# Patient Record
Sex: Female | Born: 1980 | Race: Black or African American | Hispanic: No | Marital: Single | Smoking: Current some day smoker
Health system: Southern US, Community
[De-identification: ages and names within clinical notes are randomized; demographics above are authoritative.]

## PROBLEM LIST (undated history)

## (undated) DIAGNOSIS — L732 Hidradenitis suppurativa: Secondary | ICD-10-CM

## (undated) DIAGNOSIS — I050 Rheumatic mitral stenosis: Secondary | ICD-10-CM

## (undated) DIAGNOSIS — R0609 Other forms of dyspnea: Secondary | ICD-10-CM

## (undated) DIAGNOSIS — M7650 Patellar tendinitis, unspecified knee: Secondary | ICD-10-CM

## (undated) DIAGNOSIS — I639 Cerebral infarction, unspecified: Secondary | ICD-10-CM

## (undated) DIAGNOSIS — J45909 Unspecified asthma, uncomplicated: Secondary | ICD-10-CM

## (undated) DIAGNOSIS — F331 Major depressive disorder, recurrent, moderate: Secondary | ICD-10-CM

## (undated) DIAGNOSIS — H5330 Unspecified disorder of binocular vision: Secondary | ICD-10-CM

## (undated) DIAGNOSIS — G039 Meningitis, unspecified: Secondary | ICD-10-CM

## (undated) DIAGNOSIS — I5032 Chronic diastolic (congestive) heart failure: Secondary | ICD-10-CM

## (undated) DIAGNOSIS — G5620 Lesion of ulnar nerve, unspecified upper limb: Secondary | ICD-10-CM

## (undated) DIAGNOSIS — R7303 Prediabetes: Secondary | ICD-10-CM

## (undated) DIAGNOSIS — R0600 Dyspnea, unspecified: Secondary | ICD-10-CM

## (undated) DIAGNOSIS — J36 Peritonsillar abscess: Secondary | ICD-10-CM

## (undated) DIAGNOSIS — M549 Dorsalgia, unspecified: Secondary | ICD-10-CM

## (undated) DIAGNOSIS — I10 Essential (primary) hypertension: Secondary | ICD-10-CM

## (undated) HISTORY — DX: Patellar tendinitis, unspecified knee: M76.50

## (undated) HISTORY — DX: Other forms of dyspnea: R06.09

## (undated) HISTORY — DX: Rheumatic mitral stenosis: I05.0

## (undated) HISTORY — DX: Dorsalgia, unspecified: M54.9

## (undated) HISTORY — DX: Unspecified asthma, uncomplicated: J45.909

## (undated) HISTORY — DX: Dyspnea, unspecified: R06.00

## (undated) HISTORY — DX: Unspecified disorder of binocular vision: H53.30

## (undated) HISTORY — DX: Lesion of ulnar nerve, unspecified upper limb: G56.20

## (undated) HISTORY — DX: Peritonsillar abscess: J36

## (undated) HISTORY — DX: Hidradenitis suppurativa: L73.2

## (undated) HISTORY — DX: Chronic diastolic (congestive) heart failure: I50.32

## (undated) HISTORY — PX: SKIN GRAFT: SHX250

## (undated) HISTORY — DX: Prediabetes: R73.03

## (undated) HISTORY — DX: Major depressive disorder, recurrent, moderate: F33.1

---

## 2004-04-10 ENCOUNTER — Other Ambulatory Visit: Payer: Self-pay

## 2004-04-10 ENCOUNTER — Emergency Department: Payer: Self-pay | Admitting: General Practice

## 2004-05-31 ENCOUNTER — Emergency Department: Payer: Self-pay | Admitting: Unknown Physician Specialty

## 2004-06-04 ENCOUNTER — Emergency Department: Payer: Self-pay | Admitting: Unknown Physician Specialty

## 2005-01-15 ENCOUNTER — Ambulatory Visit (HOSPITAL_COMMUNITY): Admission: RE | Admit: 2005-01-15 | Discharge: 2005-01-15 | Payer: Self-pay | Admitting: Gynecology

## 2005-03-09 ENCOUNTER — Observation Stay: Payer: Self-pay | Admitting: Obstetrics and Gynecology

## 2005-04-23 ENCOUNTER — Observation Stay: Payer: Self-pay | Admitting: Obstetrics & Gynecology

## 2005-05-16 ENCOUNTER — Inpatient Hospital Stay: Payer: Self-pay | Admitting: Obstetrics & Gynecology

## 2005-07-01 ENCOUNTER — Emergency Department: Payer: Self-pay | Admitting: General Practice

## 2005-12-25 ENCOUNTER — Emergency Department: Payer: Self-pay | Admitting: Emergency Medicine

## 2006-05-16 ENCOUNTER — Emergency Department: Payer: Self-pay | Admitting: Emergency Medicine

## 2007-03-25 ENCOUNTER — Observation Stay: Payer: Self-pay | Admitting: Obstetrics and Gynecology

## 2007-03-26 ENCOUNTER — Observation Stay: Payer: Self-pay

## 2007-04-03 ENCOUNTER — Inpatient Hospital Stay: Payer: Self-pay | Admitting: Obstetrics and Gynecology

## 2007-07-07 ENCOUNTER — Emergency Department: Payer: Self-pay | Admitting: Emergency Medicine

## 2007-08-08 ENCOUNTER — Emergency Department: Payer: Self-pay | Admitting: Emergency Medicine

## 2007-08-22 ENCOUNTER — Emergency Department: Payer: Self-pay | Admitting: Emergency Medicine

## 2008-04-19 ENCOUNTER — Emergency Department: Payer: Self-pay | Admitting: Emergency Medicine

## 2008-10-09 ENCOUNTER — Encounter: Payer: Self-pay | Admitting: Obstetrics and Gynecology

## 2008-12-18 ENCOUNTER — Encounter: Payer: Self-pay | Admitting: Maternal & Fetal Medicine

## 2009-01-12 ENCOUNTER — Observation Stay: Payer: Self-pay

## 2009-01-29 ENCOUNTER — Encounter: Payer: Self-pay | Admitting: Obstetrics and Gynecology

## 2009-03-05 ENCOUNTER — Inpatient Hospital Stay: Payer: Self-pay

## 2009-04-02 ENCOUNTER — Emergency Department: Payer: Self-pay | Admitting: Emergency Medicine

## 2009-04-25 ENCOUNTER — Emergency Department: Payer: Self-pay | Admitting: Emergency Medicine

## 2010-04-11 ENCOUNTER — Encounter: Payer: Self-pay | Admitting: Obstetrics & Gynecology

## 2010-05-09 ENCOUNTER — Encounter: Payer: Self-pay | Admitting: Obstetrics and Gynecology

## 2010-06-20 ENCOUNTER — Encounter: Payer: Self-pay | Admitting: Maternal and Fetal Medicine

## 2010-07-22 ENCOUNTER — Emergency Department: Payer: Self-pay | Admitting: Emergency Medicine

## 2010-08-08 ENCOUNTER — Encounter: Payer: Self-pay | Admitting: Maternal & Fetal Medicine

## 2010-09-01 ENCOUNTER — Inpatient Hospital Stay: Payer: Self-pay

## 2010-09-29 ENCOUNTER — Emergency Department: Payer: Self-pay | Admitting: Emergency Medicine

## 2011-02-10 ENCOUNTER — Emergency Department: Payer: Self-pay | Admitting: *Deleted

## 2011-12-26 ENCOUNTER — Observation Stay: Payer: Self-pay

## 2011-12-26 LAB — PIH PROFILE
BUN: 5 mg/dL — ABNORMAL LOW (ref 7–18)
Calcium, Total: 8.3 mg/dL — ABNORMAL LOW (ref 8.5–10.1)
Co2: 24 mmol/L (ref 21–32)
HGB: 9.9 g/dL — ABNORMAL LOW (ref 12.0–16.0)
Osmolality: 275 (ref 275–301)
Potassium: 2.9 mmol/L — ABNORMAL LOW (ref 3.5–5.1)
RBC: 3.59 10*6/uL — ABNORMAL LOW (ref 3.80–5.20)
RDW: 13.8 % (ref 11.5–14.5)
Sodium: 140 mmol/L (ref 136–145)
WBC: 12.2 10*3/uL — ABNORMAL HIGH (ref 3.6–11.0)

## 2011-12-26 LAB — PROTEIN / CREATININE RATIO, URINE
Creatinine, Urine: 122.3 mg/dL (ref 30.0–125.0)
Protein, Random Urine: 80 mg/dL — ABNORMAL HIGH (ref 0–12)

## 2011-12-28 ENCOUNTER — Inpatient Hospital Stay: Payer: Self-pay | Admitting: Obstetrics and Gynecology

## 2011-12-28 LAB — PIH PROFILE
Calcium, Total: 8.9 mg/dL (ref 8.5–10.1)
Chloride: 105 mmol/L (ref 98–107)
EGFR (African American): >60
Glucose: 74 mg/dL (ref 65–99)
HGB: 10.5 g/dL — ABNORMAL LOW (ref 12.0–16.0)
MCH: 27.3 pg (ref 26.0–34.0)
MCHC: 32.5 g/dL (ref 32.0–36.0)
MCV: 84 fL (ref 80–100)
Platelet: 342 10*3/uL (ref 150–440)
RBC: 3.84 10*6/uL (ref 3.80–5.20)
Uric Acid: 4.5 mg/dL (ref 2.6–6.0)
WBC: 14.6 10*3/uL — ABNORMAL HIGH (ref 3.6–11.0)

## 2011-12-29 LAB — PIH PROFILE
Anion Gap: 7 (ref 7–16)
BUN: 7 mg/dL (ref 7–18)
Creatinine: 0.66 mg/dL (ref 0.60–1.30)
EGFR (African American): >60
EGFR (Non-African Amer.): >60
HGB: 10.7 g/dL — ABNORMAL LOW (ref 12.0–16.0)
MCHC: 32.9 g/dL (ref 32.0–36.0)
MCV: 84 fL (ref 80–100)
Osmolality: 272 (ref 275–301)
Platelet: 354 10*3/uL (ref 150–440)
Potassium: 3.7 mmol/L (ref 3.5–5.1)
SGOT(AST): 18 U/L (ref 15–37)
WBC: 15.3 10*3/uL — ABNORMAL HIGH (ref 3.6–11.0)

## 2011-12-29 LAB — HEMATOCRIT: HCT: 32.1 % — ABNORMAL LOW (ref 35.0–47.0)

## 2012-01-13 ENCOUNTER — Emergency Department: Payer: Self-pay | Admitting: Emergency Medicine

## 2012-07-17 ENCOUNTER — Emergency Department: Payer: Self-pay | Admitting: Emergency Medicine

## 2012-07-23 ENCOUNTER — Emergency Department: Payer: Self-pay | Admitting: Emergency Medicine

## 2012-07-24 LAB — CBC WITH DIFFERENTIAL/PLATELET
Basophil #: 0.1 10*3/uL (ref 0.0–0.1)
Eosinophil %: 6 %
HGB: 12.4 g/dL (ref 12.0–16.0)
Lymphocyte #: 3.1 10*3/uL (ref 1.0–3.6)
Lymphocyte %: 25.2 %
MCH: 27.7 pg (ref 26.0–34.0)
MCHC: 32.9 g/dL (ref 32.0–36.0)
MCV: 84 fL (ref 80–100)
Monocyte #: 0.7 x10 3/mm (ref 0.2–0.9)
Monocyte %: 5.7 %
Neutrophil #: 7.6 10*3/uL — ABNORMAL HIGH (ref 1.4–6.5)
Neutrophil %: 62.3 %
RDW: 15.2 % — ABNORMAL HIGH (ref 11.5–14.5)

## 2012-07-24 LAB — URIC ACID: Uric Acid: 4.8 mg/dL (ref 2.6–6.0)

## 2013-02-28 ENCOUNTER — Emergency Department: Payer: Self-pay | Admitting: Internal Medicine

## 2013-03-02 LAB — BETA STREP CULTURE(ARMC)

## 2014-01-13 ENCOUNTER — Emergency Department: Payer: Self-pay | Admitting: Emergency Medicine

## 2014-03-18 ENCOUNTER — Emergency Department: Payer: Self-pay | Admitting: Internal Medicine

## 2014-08-27 ENCOUNTER — Emergency Department: Payer: Self-pay | Admitting: Physician Assistant

## 2014-09-03 ENCOUNTER — Emergency Department: Payer: Self-pay | Admitting: Internal Medicine

## 2014-11-14 NOTE — H&P (Signed)
L&D Evaluation:  History:   HPI 34yo [id number] with LMP of 03/26/11 & EDD of 12/31/11 & L/01/11/12 with pt being sent from [facility] significant for BP today of 174/98 & 2+ proteinuria +facial swelling in mornings x 2-3 weeks. Pt denies any HA, blurred vision, abd pain. Pt does have Braxton-Hicks x several weeks. Pt here in [facility] with BP in the range of 123 to 146 over 50-63. No evidence of pre-ecclampsia noted. Pt is feeling B-H's as she has in the past few weeks. Noo ROM, VB, decreased FM or active labor pattern. UC's are every 8 to 10 mins. Prot/creat ratio is 650 today.    Patient's Medical History PTD x 2, Obesity, Anemia, Non-compliant with PNC, hidradenitis supprativa, depression, migraine HA's, UTI, LGSIL, Asthma, Anemia    Patient's Surgical History Lt foot surgery    Medications Pre Natal Vitamins    Allergies NKDA    Social History tobacco    Family History Non-Contributory   ROS:   ROS All systems were reviewed.  HEENT, CNS, GI, GU, Respiratory, CV, Renal and Musculoskeletal systems were found to be normal.   Exam:   Vital Signs stable  See BP's    General no apparent distress    Mental Status clear    Chest clear    Heart normal sinus rhythm, no murmur/gallop/rubs    Abdomen gravid, non-tender    Estimated Fetal Weight Average for gestational age    Back no CVAT    Reflexes 1+    Pelvic 2/60/vtx -2    Mebranes Intact    FHT normal rate with no decels, reactive NST    Ucx regular    Skin dry    Lymph no lymphadenopathy   Impression:   Impression IUP at 36 4/7 weeks with Gest HTN, early pre-ecclampsia   Plan:   Plan 24 h urine for protein.creat    Comments Continue bedrest to keep BP in normal range. Lt side at night   Electronic Signatures: Sharee PimpleJones, Caron W (CNM)  (Signed 21-Jun-13 16:52)  Authored: L&D Evaluation   Last Updated: 21-Jun-13 16:52 by Sharee PimpleJones, Caron W (CNM)

## 2014-11-14 NOTE — H&P (Signed)
L&D Evaluation:  History:   HPI 34 yo [id number] with LMP of 03/26/11 & EDD of 12/31/11 & L/01/11/12 [facility] to [facility] at 1850 and delivered at 1857, SROM of lt mec in the ER, pt had been instructed to return this am for NST and to turn in her 24 h urine for protein but, had no transportation so did not follow through with collection of the urine. Pt deneis any HA, blurred vision, RUQ  pain or any other Symptoms. Pt delivered by [place] Day, RN while CJones being paged. Cjones arrived at 1900 to find baby doing well and delivered spont the placenta. Perineum intact. Pt BP is now treding 161/85 to 173/88. P 90's. Ff and lochia mod.    Presents with contractions    Patient's Medical History PTD x 2, Obesity, Non-compliant with PNC, Hidradenitis Supprativae, Migraine HA's. Depression, UTI, LGSIL, Asthma, Anemia    Patient's Surgical History Lt foot surgery    Medications Pre [place]atal Vitamins    Social History none  tobacco   ROS:   ROS All systems were reviewed.  HEENT, CNS, GI, GU, Respiratory, CV, Renal and Musculoskeletal systems were found to be normal.   Exam:   Vital Signs BP ranges are high:See graphics    General no apparent distress    Mental Status clear    Chest clear    Heart normal sinus rhythm, no murmur/gallop/rubs    Abdomen gravid, non-tender    Estimated Fetal Weight Average for gestational age    Back no CVAT    Edema 1+    Reflexes 2+  Brisk    Clonus negative    Mebranes Ruptured, SROM in ER    Description Lt mec    Ucx regular    Skin dry    Lymph no lymphadenopathy   Impression:   Impression iUP at term delivered   Plan:   Plan Recov er PP, Will watch BP's.    Comments Since pt is bleeding will not repeat the urine prot/creat now. If BP does not stabilize, will treat.   Electronic Signatures: Sharee PimpleJones, Dierra Riesgo W (CNM)  (Signed 23-Jun-13 19:55)  Authored: L&D Evaluation   Last Updated: 23-Jun-13 19:55 by Sharee PimpleJones, Waneda Klammer W (CNM)

## 2014-11-27 ENCOUNTER — Encounter: Payer: Self-pay | Admitting: Emergency Medicine

## 2014-11-27 ENCOUNTER — Emergency Department
Admission: EM | Admit: 2014-11-27 | Discharge: 2014-11-27 | Disposition: A | Discharge status: Home / Self Care | Payer: Medicaid Other | Attending: Emergency Medicine | Admitting: Emergency Medicine

## 2014-11-27 DIAGNOSIS — K047 Periapical abscess without sinus: Secondary | ICD-10-CM | POA: Diagnosis not present

## 2014-11-27 DIAGNOSIS — I10 Essential (primary) hypertension: Secondary | ICD-10-CM | POA: Insufficient documentation

## 2014-11-27 DIAGNOSIS — K088 Other specified disorders of teeth and supporting structures: Secondary | ICD-10-CM | POA: Diagnosis present

## 2014-11-27 DIAGNOSIS — K029 Dental caries, unspecified: Secondary | ICD-10-CM | POA: Diagnosis not present

## 2014-11-27 DIAGNOSIS — K0889 Other specified disorders of teeth and supporting structures: Secondary | ICD-10-CM

## 2014-11-27 DIAGNOSIS — K0381 Cracked tooth: Secondary | ICD-10-CM | POA: Diagnosis not present

## 2014-11-27 MED ORDER — PENICILLIN V POTASSIUM 500 MG PO TABS: 500.0000 mg | ORAL_TABLET | Freq: Four times a day (QID) | ORAL | Status: DC

## 2014-11-27 MED ORDER — OXYCODONE-ACETAMINOPHEN 5-325 MG PO TABS: 1.0000 | ORAL_TABLET | Freq: Three times a day (TID) | ORAL | Status: DC | PRN

## 2014-11-27 MED ORDER — IBUPROFEN 800 MG PO TABS: 800.0000 mg | ORAL_TABLET | Freq: Three times a day (TID) | ORAL | Status: DC | PRN

## 2014-11-27 NOTE — ED Notes (Signed)
Patient with right upper molar pain since this am. Tearful in triage.

## 2014-11-27 NOTE — Discharge Instructions (Signed)
Dental Abscess A dental abscess is a collection of infected fluid (pus) from a bacterial infection in the inner part of the tooth (pulp). It usually occurs at the end of the tooth's root.  CAUSES   Severe tooth decay.  Trauma to the tooth that allows bacteria to enter into the pulp, such as a broken or chipped tooth. SYMPTOMS   Severe pain in and around the infected tooth.  Swelling and redness around the abscessed tooth or in the mouth or face.  Tenderness.  Pus drainage.  Bad breath.  Bitter taste in the mouth.  Difficulty swallowing.  Difficulty opening the mouth.  Nausea.  Vomiting.  Chills.  Swollen neck glands. DIAGNOSIS   A medical and dental history will be taken.  An examination will be performed by tapping on the abscessed tooth.  X-rays may be taken of the tooth to identify the abscess. TREATMENT The goal of treatment is to eliminate the infection. You may be prescribed antibiotic medicine to stop the infection from spreading. A root canal may be performed to save the tooth. If the tooth cannot be saved, it may be pulled (extracted) and the abscess may be drained.  HOME CARE INSTRUCTIONS  Only take over-the-counter or prescription medicines for pain, fever, or discomfort as directed by your caregiver.  Rinse your mouth (gargle) often with salt water ( tsp salt in 8 oz [250 ml] of warm water) to relieve pain or swelling.  Do not drive after taking pain medicine (narcotics).  Do not apply heat to the outside of your face.  Return to your dentist for further treatment as directed. SEEK MEDICAL CARE IF:  Your pain is not helped by medicine.  Your pain is getting worse instead of better. SEEK IMMEDIATE MEDICAL CARE IF:  You have a fever or persistent symptoms for more than 2-3 days.  You have a fever and your symptoms suddenly get worse.  You have chills or a very bad headache.  You have problems breathing or swallowing.  You have trouble  opening your mouth.  You have swelling in the neck or around the eye. Document Released: 06/23/2005 Document Revised: 03/17/2012 Document Reviewed: 10/01/2010 [facility] Patient Information 2015 [place], [place]. This information is not intended to replace advice given to you by your health care provider. Make sure you discuss any questions you have with your health care provider.  Dental Pain Toothache is pain in or around a tooth. It may get worse with chewing or with cold or heat.  HOME CARE  Your dentist may use a numbing medicine during treatment. If so, you may need to avoid eating until the medicine wears off. Ask your dentist about this.  Only take medicine as told by your dentist or doctor.  Avoid chewing food near the painful tooth until after all treatment is done. Ask your dentist about this. GET HELP RIGHT AWAY IF:   The problem gets worse or new problems appear.  You have a fever.  There is redness and puffiness (swelling) of the face, jaw, or neck.  You cannot open your mouth.  There is pain in the jaw.  There is very bad pain that is not helped by medicine. MAKE SURE YOU:   Understand these instructions.  Will watch your condition.  Will get help right away if you are not doing well or get worse. Document Released: 12/10/2007 Document Revised: 09/15/2011 Document Reviewed: 12/10/2007 [facility] Patient Information 2015 [place], [place]. This information is not intended to replace advice given  to you by your health care provider. Make sure you discuss any questions you have with your health care provider.   OPTIONS FOR DENTAL FOLLOW UP CARE  [place] Department of Health and Human Services - Local Safety Net Dental Clinics [email or web address]   [facility] [phone])  [facility]arl Ma [phone])  [place] [phone] ext [phone])  [facility]  [phone])  [facility] [phone]) This clinic caters to the indigent population and is on a lottery system. Location: [employer] [facility], [employer], [address], [place] Clinic Hours: Wednesdays from 6pm - 9pm, patients seen by a lottery system. For dates, call or go to [email or web address] Services: Cleanings, fillings and simple extractions. Payment Options: DENTAL WORK IS FREE OF CHARGE. Bring proof of income or support. Best way to get seen: Arrive at 5:15 pm - this is a lottery, NOT first come/first serve, so arriving earlier will not increase your chances of being seen.     [facility] [phone] Select option 1 for emergencies   Location: [facility], [place], [address], [place] Clinic Hours: No walk-ins accepted - call the day before to schedule an appointment. Check in times are 9:30 am and 1:30 pm. Services: Simple extractions, temporary fillings, pulpectomy/pulp debridement, uncomplicated abscess drainage. Payment Options: PAYMENT IS DUE AT THE TIME OF SERVICE.  Fee is usually $100-200, additional surgical procedures (e.g. abscess drainage) may be extra. Cash, checks, Visa/MasterCard accepted.  Can file Medicaid if patient is covered for dental - patient should call case worker to check. No discount for [facility] patients. Best way to get seen: MUST call the day before and get onto the schedule. Can usually be seen the next 1-2 days. No walk-ins accepted.     [facility] [phone]   Location: [facility], [address], [place] Clinic Hours: M, W, Th, F 8am or 1:30pm, Tues 9a or 1:30 - first come/first served. Services: Simple extractions, temporary fillings, uncomplicated abscess drainage.  You do not need to be an [facility] resident. Payment Options: PAYMENT IS DUE AT THE TIME OF SERVICE. Dental insurance,  otherwise sliding scale - bring proof of income or support. Depending on income and treatment needed, cost is usually $50-200. Best way to get seen: Arrive early as it is first come/first served.     [facility] [phone]   Location: [address] Clinic Hours: Mon-Thu 8a-5p Services: Most basic dental services including extractions and fillings. Payment Options: PAYMENT IS DUE AT THE TIME OF SERVICE. Sliding scale, up to 50% off - bring proof if income or support. Medicaid with dental option accepted. Best way to get seen: Call to schedule an appointment, can usually be seen within 2 weeks OR they will try to see walk-ins - show up at 8a or 2p (you may have to wait).     [facility] [phone] [place] RESIDENTS ONLY   Location: [facility], [address], [place], [place] [address] Clinic Hours: By appointment only. Monday - Thursday 8am-5pm, Friday 8am-12pm Services: Cleanings, fillings, extractions. Payment Options: PAYMENT IS DUE AT THE TIME OF SERVICE. Cash, Visa or MasterCard. Sliding scale - $30 minimum per service. Best way to get seen: Come in to office, complete packet and make an appointment - need proof of income or support monies for each household member and proof of [facility] residence. Usually  takes about a month to get in.     [facility] [phone]   Location: [address] [facility] Clinic Hours: Walk-in Urgent Care Dental Services are offered Monday-Friday mornings only. The numbers of emergencies accepted daily is limited to the number of providers available. Maximum 15 - Mondays, Wednesdays & Thursdays Maximum 10 - Tuesdays & Fridays Services: You do not need to be a [place] resident to be seen for a dental emergency. Emergencies are defined as pain, swelling, abnormal bleeding, or dental trauma. Walkins  will receive x-rays if needed. NOTE: Dental cleaning is not an emergency. Payment Options: PAYMENT IS DUE AT THE TIME OF SERVICE. Minimum co-pay is $40.00 for uninsured patients. Minimum co-pay is $3.00 for Medicaid with dental coverage. Dental Insurance is accepted and must be presented at time of visit. Medicare does not cover dental. Forms of payment: Cash, credit card, checks. Best way to get seen: If not previously registered with the clinic, walk-in dental registration begins at 7:15 am and is on a first come/first serve basis. If previously registered with the clinic, call to make an appointment.     [facility] [phone] [place] RESIDENTS ONLY   Location: [address], [place], [place] Clinic Hours: Mon-Thu 10a-2p Services: Extractions only! Payment Options: FREE (donations accepted) - bring proof of income or support Best way to get seen: Call and schedule an appointment OR come at 8am on the 1st Monday of every month (except for holidays) when it is first come/first served.     [facility] [phone]   Location: [address], [place] Clinic Hours: Friday mornings Services, Payment Options, Best way to get seen: Call for info

## 2014-11-27 NOTE — ED Provider Notes (Signed)
[facility] Emergency Department Provider Note ____________________________________________  Time seen: Approximately 6:14 PM  I have reviewed the triage vital signs and the nursing notes.   HISTORY  Chief Complaint Dental Pain   HPI Tiffany Hickman is a 34 y.o. female presents to the ER for complaints of right upper dental pain. Patient states that the pain has been gradual onset for the last 2-3 days. Patient states that early this morning she felt a pop in her mouth and had a bad taste. Patient states that she thinks she had an abscess that broke. Patient reports she has had multiple fractured teeth for years with intermittent problems. Denies fall or injury. Denies fever. Reports continues to eat and drink well.  History reviewed. No pertinent past medical history.  HTN Diabetes "borderline"  There are no active problems to display for this patient.   History reviewed. No pertinent past surgical history.  No current outpatient prescriptions on file.  Allergies Review of patient's allergies indicates no known allergies.  No family history on file.  Social History History  Substance Use Topics  . Smoking status: Never Smoker   . Smokeless tobacco: Not on file  . Alcohol Use: No    Review of Systems Constitutional: No fever/chills Eyes: No visual changes. ENT: No sore throat. Cardiovascular: Denies chest pain. Respiratory: Denies shortness of breath. Gastrointestinal: No abdominal pain.  No nausea, no vomiting.  No diarrhea.  No constipation. Genitourinary: Negative for dysuria. Musculoskeletal: Negative for back pain. Skin: Negative for rash. Neurological: Negative for headaches, focal weakness or numbness.  10-point ROS otherwise negative.  ____________________________________________   PHYSICAL EXAM:  VITAL SIGNS: ED Triage Vitals  Enc Vitals Group     BP 11/27/14 1702 165/95 mmHg     Pulse Rate 11/27/14 1702 70     Resp  11/27/14 1702 20     Temp 11/27/14 1702 98.3 F (36.8 C)     Temp Source 11/27/14 1702 Oral     SpO2 11/27/14 1702 98 %     Weight 11/27/14 1702 260 lb (117.935 kg)     Height 11/27/14 1702  (1.676 m)     Head Cir --      Peak Flow --      Pain Score 11/27/14 1703 8     Pain Loc --      Pain Edu? --      Excl. in GC? --     Constitutional: Alert and oriented. Well appearing and in no acute distress. Eyes: Conjunctivae are normal. PERRL. EOMI. Head: Atraumatic. Nose: No congestion/rhinnorhea. Mouth/Throat: Mucous membranes are moist.  Oropharynx non-erythematous. Mild to mod TTP to right upper 2-4 teeth, with mild gum erythema and swelling with dental caries. Along gum line appearance of drained abscess, no induration or fluctuance, minimal purulent drainage with expression. Multiple other fracture teeth and dental caries.  Neck: No stridor.  No cervical spine tenderness to palpation. Hematological/Lymphatic/Immunilogical: No cervical lymphadenopathy. Cardiovascular: Normal rate, regular rhythm. Grossly normal heart sounds.  Good peripheral circulation. Respiratory: Normal respiratory effort.  No retractions. Lungs CTAB. Gastrointestinal: Soft and nontender. No distention. No abdominal bruits Musculoskeletal: No lower extremity tenderness nor edema.   Neurologic:  Normal speech and language. No gross focal neurologic deficits are appreciated. Speech is normal. No gait instability. Skin:  Skin is warm, dry and intact. No rash noted. Psychiatric: Mood and affect are normal. Speech and behavior are normal.  ____________________________________________   INITIAL IMPRESSION / ASSESSMENT AND PLAN /  ED COURSE  Pertinent labs & imaging results that were available during my care of the patient were reviewed by me and considered in my medical decision making (see chart for details).  Presents to ER with complaints of right upper dental pain x 2-3 days. Widespread dental decay with  multiple fractures. Appearance of drained abscess, no current abscess palpated or seen, no indication for I/D. Continues to tolerate food and fluids. No acute distress. Will treat with PRN pain medication and Pen VK. Discussed close follow up with PCP and follow up with dentist. Return to ER for new or worsening concerns.  ____________________________________________   FINAL CLINICAL IMPRESSION(S) / ED DIAGNOSES  Final diagnoses:  Pain, dental  Dental abscess     Renford DillsLindsey Aaliyan Brinkmeier, NP 11/27/14 1826  Minna AntisKevin Paduchowski, MD 11/27/14 2356

## 2015-01-02 ENCOUNTER — Encounter: Payer: Self-pay | Admitting: Emergency Medicine

## 2015-01-02 ENCOUNTER — Emergency Department
Admission: EM | Admit: 2015-01-02 | Discharge: 2015-01-02 | Disposition: A | Discharge status: Home / Self Care | Payer: Medicaid Other | Attending: Emergency Medicine | Admitting: Emergency Medicine

## 2015-01-02 DIAGNOSIS — Z792 Long term (current) use of antibiotics: Secondary | ICD-10-CM | POA: Diagnosis not present

## 2015-01-02 DIAGNOSIS — Y998 Other external cause status: Secondary | ICD-10-CM | POA: Diagnosis not present

## 2015-01-02 DIAGNOSIS — L0100 Impetigo, unspecified: Secondary | ICD-10-CM | POA: Insufficient documentation

## 2015-01-02 DIAGNOSIS — Y9389 Activity, other specified: Secondary | ICD-10-CM | POA: Insufficient documentation

## 2015-01-02 DIAGNOSIS — W57XXXA Bitten or stung by nonvenomous insect and other nonvenomous arthropods, initial encounter: Secondary | ICD-10-CM | POA: Insufficient documentation

## 2015-01-02 DIAGNOSIS — Y9289 Other specified places as the place of occurrence of the external cause: Secondary | ICD-10-CM | POA: Diagnosis not present

## 2015-01-02 DIAGNOSIS — S0086XA Insect bite (nonvenomous) of other part of head, initial encounter: Secondary | ICD-10-CM | POA: Diagnosis present

## 2015-01-02 DIAGNOSIS — I10 Essential (primary) hypertension: Secondary | ICD-10-CM | POA: Insufficient documentation

## 2015-01-02 HISTORY — DX: Essential (primary) hypertension: I10

## 2015-01-02 MED ORDER — PREDNISONE 20 MG PO TABS: 40.0000 mg | ORAL_TABLET | Freq: Every day | ORAL | Status: DC

## 2015-01-02 MED ORDER — CEPHALEXIN 500 MG PO CAPS
ORAL_CAPSULE | ORAL | Status: AC
  Administered 2015-01-02: 500 mg via ORAL
  Filled 2015-01-02: qty 1

## 2015-01-02 MED ORDER — CEPHALEXIN 500 MG PO CAPS
500.0000 mg | ORAL_CAPSULE | Freq: Once | ORAL | Status: AC
  Administered 2015-01-02: 500 mg via ORAL

## 2015-01-02 MED ORDER — LORATADINE 10 MG PO TABS: 10.0000 mg | ORAL_TABLET | Freq: Every day | ORAL | Status: DC

## 2015-01-02 MED ORDER — PREDNISONE 20 MG PO TABS
60.0000 mg | ORAL_TABLET | Freq: Once | ORAL | Status: AC
  Administered 2015-01-02: 60 mg via ORAL

## 2015-01-02 MED ORDER — PREDNISONE 10 MG PO TABS
ORAL_TABLET | ORAL | Status: AC
  Administered 2015-01-02: 60 mg via ORAL
  Filled 2015-01-02: qty 6

## 2015-01-02 MED ORDER — CEPHALEXIN 500 MG PO CAPS: 500.0000 mg | ORAL_CAPSULE | Freq: Four times a day (QID) | ORAL | Status: AC

## 2015-01-02 NOTE — ED Notes (Signed)
Pt to ed via EMS from home with c/o swelling to right side of face.  Pt states she noticed it this am when she awoke and is worried a spider bit her during the night.

## 2015-01-02 NOTE — ED Notes (Signed)
Woke up possible insect bite to right face, has swelling and some reddness

## 2015-01-02 NOTE — ED Provider Notes (Signed)
[facility] Emergency Department Provider Note  ____________________________________________  Time seen: Approximately 8:20 AM  I have reviewed the triage vital signs and the nursing notes.   HISTORY  Chief Complaint Facial Swelling and Insect Bite    HPI Tiffany Hickman is a 34 y.o. female arrives here today with swelling of her right cheek states that she thinks she was bitten by a bug yesterday early this morning scratch the area she says it's mildly swollen and now has a yellow crust to it rates her overall pain as a 2 out of 10 nothing particularly making it better or worse and has no other symptoms at this time is here today for further evaluation and treatment   Past Medical History  Diagnosis Date  . Hypertension     There are no active problems to display for this patient.   History reviewed. No pertinent past surgical history.  Current Outpatient Rx  Name  Route  Sig  Dispense  Refill  . cephALEXin (KEFLEX) 500 MG capsule   Oral   Take 1 capsule (500 mg total) by mouth 4 (four) times daily.   40 capsule   0   . ibuprofen (ADVIL,MOTRIN) 800 MG tablet   Oral   Take 1 tablet (800 mg total) by mouth every 8 (eight) hours as needed for mild pain or moderate pain.   15 tablet   0   . loratadine (CLARITIN) 10 MG tablet   Oral   Take 1 tablet (10 mg total) by mouth daily.   10 tablet   0   . oxyCODONE-acetaminophen (ROXICET) 5-325 MG per tablet   Oral   Take 1 tablet by mouth every 8 (eight) hours as needed for moderate pain or severe pain (Do not drive or operate heavy machinery while taking as can cause drowsiness.).   9 tablet   0   . penicillin v potassium (VEETID) 500 MG tablet   Oral   Take 1 tablet (500 mg total) by mouth 4 (four) times daily.   40 tablet   0   . predniSONE (DELTASONE) 20 MG tablet   Oral   Take 2 tablets (40 mg total) by mouth daily with breakfast.   8 tablet   0     Allergies Review of  patient's allergies indicates no known allergies.  Family History  Problem Relation Age of Onset  . Diabetes Mother     Social History History  Substance Use Topics  . Smoking status: Never Smoker   . Smokeless tobacco: Not on file  . Alcohol Use: No    Review of Systems Constitutional: No fever/chills Eyes: No visual changes. ENT: No sore throat. Cardiovascular: Denies chest pain. Respiratory: Denies shortness of breath. Gastrointestinal: No abdominal pain.  No nausea, no vomiting.  No diarrhea.  No constipation. Genitourinary: Negative for dysuria. Musculoskeletal: Negative for back pain. Skin: Negative for rash. Neurological: Negative for headaches, focal weakness or numbness.  10-point ROS otherwise negative.  ____________________________________________   PHYSICAL EXAM:  VITAL SIGNS: ED Triage Vitals  Enc Vitals Group     BP 01/02/15 0816 168/93 mmHg     Pulse Rate 01/02/15 0813 70     Resp 01/02/15 0813 18     Temp 01/02/15 0813 98.2 F (36.8 C)     Temp Source 01/02/15 0813 Oral     SpO2 01/02/15 0813 100 %     Weight 01/02/15 0813 260 lb (117.935 kg)     Height 01/02/15 0813  5\' 6"  (1.676 m)     Head Cir --      Peak Flow --      Pain Score 01/02/15 0815 2     Pain Loc --      Pain Edu? --      Excl. in GC? --     Constitutional: Alert and oriented. Well appearing and in no acute distress. Eyes: Conjunctivae are normal. PERRL. EOMI. Head: Atraumatic. Nose: No congestion/rhinnorhea. Mouth/Throat: Mucous membranes are moist.  Oropharynx non-erythematous. Neck: No stridor.   Cardiovascular: Normal rate, regular rhythm. Grossly normal heart sounds.  Good peripheral circulation. Respiratory: Normal respiratory effort.  No retractions. Lungs CTAB. Musculoskeletal: No lower extremity tenderness nor edema.  No joint effusions. Neurologic:  Normal speech and language. No gross focal neurologic deficits are appreciated. Speech is normal. No gait  instability. Skin: right cheek mildly inflamed has a honey crusted area with mild induration no fluctuance no sign of focal abscess Psychiatric: Mood and affect are normal. Speech and behavior are normal.  ____________________________________________   PROCEDURES  Procedure(s) performed: None  Critical Care performed: No  ____________________________________________   INITIAL IMPRESSION / ASSESSMENT AND PLAN / ED COURSE  Pertinent labs & imaging results that were available during my care of the patient were reviewed by me and considered in my medical decision making (see chart for details).  Initial impression on this patient is a facial infection likely impetigo patient believes she was bitten on the right cheek either yesterday or today by a bug now it's swollen she has yellow crusted area mildly tender to touch denies fevers chills given this will go and start her on Keflex and have her follow-up in 2-3 days for recheck if it persists return here otherwise for any acute concerns or worsening symptoms ____________________________________________   FINAL CLINICAL IMPRESSION(S) / ED DIAGNOSES  Final diagnoses:  Impetigo      Chere Babson Rosalyn GessWilliam C Aamna Mallozzi, PA-C 01/02/15 0845  Sharman CheekPhillip Stafford, MD 01/02/15 [phone]

## 2015-03-18 ENCOUNTER — Encounter: Payer: Self-pay | Admitting: Emergency Medicine

## 2015-03-18 ENCOUNTER — Emergency Department
Admission: EM | Admit: 2015-03-18 | Discharge: 2015-03-18 | Disposition: A | Discharge status: Home / Self Care | Payer: Medicaid Other | Attending: Emergency Medicine | Admitting: Emergency Medicine

## 2015-03-18 DIAGNOSIS — I10 Essential (primary) hypertension: Secondary | ICD-10-CM | POA: Insufficient documentation

## 2015-03-18 DIAGNOSIS — J029 Acute pharyngitis, unspecified: Secondary | ICD-10-CM | POA: Insufficient documentation

## 2015-03-18 LAB — POCT RAPID STREP A: Streptococcus, Group A Screen (Direct): NEGATIVE

## 2015-03-18 MED ORDER — IBUPROFEN 800 MG PO TABS: 800.0000 mg | ORAL_TABLET | Freq: Three times a day (TID) | ORAL | Status: DC | PRN

## 2015-03-18 MED ORDER — LIDOCAINE VISCOUS 2 % MT SOLN: 20.0000 mL | OROMUCOSAL | Status: DC | PRN

## 2015-03-18 MED ORDER — AMOXICILLIN 500 MG PO TABS: 500.0000 mg | ORAL_TABLET | Freq: Two times a day (BID) | ORAL | Status: DC

## 2015-03-18 NOTE — ED Provider Notes (Signed)
Tiffany Hickman  ____________________________________________  Time seen: Approximately 5:18 PM  I have reviewed the triage vital signs and the nursing notes.   HISTORY  Chief Complaint Sore Throat and Generalized Body Aches    HPI Tiffany Hickman is a 34 y.o. female who presents for evaluation of sudden onset of sore throat fever chills body aches. Patient states symptoms developed yesterday and has worsened today.   Past Medical History  Diagnosis Date  . Hypertension     There are no active problems to display for this patient.   History reviewed. No pertinent past surgical history.  Current Outpatient Rx  Name  Route  Sig  Dispense  Refill  . amoxicillin (AMOXIL) 500 MG tablet   Oral   Take 1 tablet (500 mg total) by mouth 2 (two) times daily.   20 tablet   0   . ibuprofen (ADVIL,MOTRIN) 800 MG tablet   Oral   Take 1 tablet (800 mg total) by mouth every 8 (eight) hours as needed.   30 tablet   0   . lidocaine (XYLOCAINE) 2 % solution   Mouth/Throat   Use as directed 20 mLs in the mouth or throat as needed for mouth pain.   100 mL   0     Allergies Review of patient's allergies indicates no known allergies.  Family History  Problem Relation Age of Onset  . Diabetes Mother     Social History Social History  Substance Use Topics  . Smoking status: Never Smoker   . Smokeless tobacco: None  . Alcohol Use: No    Review of Systems Constitutional: Positive fever chills Eyes: No visual changes. ENT: Positive sore throat Cardiovascular: Denies chest pain. Respiratory: Denies shortness of breath. Gastrointestinal: No abdominal pain.  No nausea, no vomiting.  No diarrhea.  No constipation. Genitourinary: Negative for dysuria. Musculoskeletal: Negative for back pain. Skin: Negative for rash. Neurological: Negative for headaches, focal weakness or numbness.  10-point ROS otherwise  negative.  ____________________________________________   PHYSICAL EXAM:  VITAL SIGNS: ED Triage Vitals  Enc Vitals Group     BP 03/18/15 1711 161/91 mmHg     Pulse Rate 03/18/15 1710 76     Resp 03/18/15 1710 20     Temp 03/18/15 1710 98.6 F (37 C)     Temp Source 03/18/15 1710 Oral     SpO2 03/18/15 1710 100 %     Weight 03/18/15 1710 268 lb (121.564 kg)     Height 03/18/15 1710 5\' 6"  (1.676 m)     Head Cir --      Peak Flow --      Pain Score 03/18/15 1711 6     Pain Loc --      Pain Edu? --      Excl. in GC? --     Constitutional: Alert and oriented. Well appearing and in no acute distress. Eyes: Conjunctivae are normal. PERRL. EOMI. Head: Atraumatic. Nose: No congestion/rhinnorhea. Mouth/Throat: Mucous membranes are moist.  Oropharynx very erythematous. Neck: No stridor.   Cardiovascular: Normal rate, regular rhythm. Grossly normal heart sounds.  Good peripheral circulation. Respiratory: Normal respiratory effort.  No retractions. Lungs CTAB. Musculoskeletal: No lower extremity tenderness nor edema.  No joint effusions. Neurologic:  Normal speech and language. No gross focal neurologic deficits are appreciated. No gait instability. Skin:  Skin is warm, dry and intact. No rash noted. Psychiatric: Mood and affect are normal. Speech and behavior are normal.  ____________________________________________   LABS (all labs ordered are listed, but only abnormal results are displayed)  Labs Reviewed  POCT RAPID STREP A   ____________________________________________    PROCEDURES  Procedure(s) performed: None  Critical Care performed: No  ____________________________________________   INITIAL IMPRESSION / ASSESSMENT AND PLAN / ED COURSE  Pertinent labs & imaging results that were available during my care of the patient were reviewed by me and considered in my medical decision making (see chart for details).  Despite negative rapid strep will Rx for  amoxicillin 500 mg 3 times a day #30. Viscous lidocaine Rx given as well. Encouraged Tylenol over-the-counter as needed for fever and aches given for Motrin 800 mg. Patient to follow up with PCP or return to the ER with any worsening symptomology. ____________________________________________   FINAL CLINICAL IMPRESSION(S) / ED DIAGNOSES  Final diagnoses:  Pharyngitis     Evangeline Dakin, PA-C 03/18/15 1812  Sharyn Creamer, MD 03/18/15 2150

## 2015-03-18 NOTE — ED Notes (Signed)
Patient to ER for c/o sore throat, mild pain in ears, generalized body aches, and pressure in head.

## 2015-03-18 NOTE — Discharge Instructions (Signed)

## 2015-04-02 ENCOUNTER — Encounter: Payer: Self-pay | Admitting: Emergency Medicine

## 2015-04-02 ENCOUNTER — Emergency Department
Admission: EM | Admit: 2015-04-02 | Discharge: 2015-04-02 | Disposition: A | Discharge status: Home / Self Care | Payer: Medicaid Other | Attending: Emergency Medicine | Admitting: Emergency Medicine

## 2015-04-02 DIAGNOSIS — K047 Periapical abscess without sinus: Secondary | ICD-10-CM | POA: Diagnosis not present

## 2015-04-02 DIAGNOSIS — E669 Obesity, unspecified: Secondary | ICD-10-CM | POA: Diagnosis not present

## 2015-04-02 DIAGNOSIS — I10 Essential (primary) hypertension: Secondary | ICD-10-CM | POA: Insufficient documentation

## 2015-04-02 DIAGNOSIS — K088 Other specified disorders of teeth and supporting structures: Secondary | ICD-10-CM | POA: Diagnosis present

## 2015-04-02 MED ORDER — AMOXICILLIN 500 MG PO CAPS: 500.0000 mg | ORAL_CAPSULE | Freq: Three times a day (TID) | ORAL | Status: DC

## 2015-04-02 MED ORDER — TRAMADOL HCL 50 MG PO TABS: 50.0000 mg | ORAL_TABLET | Freq: Four times a day (QID) | ORAL | Status: DC | PRN

## 2015-04-02 MED ORDER — LIDOCAINE VISCOUS 2 % MT SOLN
15.0000 mL | Freq: Once | OROMUCOSAL | Status: AC
  Administered 2015-04-02: 15 mL via OROMUCOSAL
  Filled 2015-04-02: qty 15

## 2015-04-02 MED ORDER — IBUPROFEN 800 MG PO TABS
800.0000 mg | ORAL_TABLET | Freq: Once | ORAL | Status: AC
  Administered 2015-04-02: 800 mg via ORAL
  Filled 2015-04-02: qty 1

## 2015-04-02 MED ORDER — IBUPROFEN 800 MG PO TABS: 800.0000 mg | ORAL_TABLET | Freq: Three times a day (TID) | ORAL | Status: DC | PRN

## 2015-04-02 NOTE — Discharge Instructions (Signed)
OPTIONS FOR DENTAL FOLLOW UP CARE ° °[place] Department of Health and Human Services - Local Safety Net Dental Clinics °http://www.ncdhhs.gov/dph/oralhealth/services/safetynetclinics.htm °  °[facility] ([phone]) ° °[facility] [place] ([phone]) ° °[facility] ([phone]) ° °[facility] ([phone]) ° °[facility] ([phone]) °This clinic caters to the indigent population and is on a lottery system. °Location: °[facility], [address], [place] °Clinic Hours: °Wednesdays from 6pm - 9pm, patients seen by a lottery system. °For dates, call or go to [email or web address].[facility].edu/[facility]/patients/Dental-[facility] °Services: °Cleanings, fillings and simple extractions. °Payment Options: °DENTAL WORK IS FREE OF CHARGE. Bring proof of income or support. °Best way to get seen: °Arrive at 5:15 pm - this is a lottery, NOT first come/first serve, so arriving earlier will not increase your chances of being seen. °  °  °[facility] °[phone] °Select option 1 for emergencies °  °Location: °[facility], [address], [place] °Clinic Hours: °No walk-ins accepted - call the day before to schedule an appointment. °Check in times are 9:30 am and 1:30 pm. °Services: °Simple extractions, temporary fillings, pulpectomy/pulp debridement, uncomplicated abscess drainage. °Payment Options: °PAYMENT IS DUE AT THE TIME OF SERVICE.  Fee is usually $100-200, additional surgical procedures (e.g. abscess drainage) may be extra. °Cash, checks, Visa/MasterCard accepted.  Can file Medicaid if patient is covered for dental - patient should call case worker to check. °No discount for [facility] patients. °Best way to get seen: °MUST call the day before and get onto the schedule. Can usually be seen the next 1-2 days. No walk-ins accepted. °  °  °[facility] °[phone] °   °Location: °[facility], [address], [place] °Clinic Hours: °M, W, Th, F 8am or 1:30pm, Tues 9a or 1:30 - first come/first served. °Services: °Simple extractions, temporary fillings, uncomplicated abscess drainage.  You do not need to be an [place] resident. °Payment Options: °PAYMENT IS DUE AT THE TIME OF SERVICE. °Dental insurance, otherwise sliding scale - bring proof of income or support. °Depending on income and treatment needed, cost is usually $50-200. °Best way to get seen: °Arrive early as it is first come/first served. °  °  °[facility] °[phone] °  °Location: °[address] °Clinic Hours: °Mon-Thu 8a-5p °Services: °Most basic dental services including extractions and fillings. °Payment Options: °PAYMENT IS DUE AT THE TIME OF SERVICE. °Sliding scale, up to 50% off - bring proof if income or support. °Medicaid with dental option accepted. °Best way to get seen: °Call to schedule an appointment, can usually be seen within 2 weeks OR they will try to see walk-ins - show up at 8a or 2p (you may have to wait). °  °  °[facility] °[phone] °[place] RESIDENTS ONLY °  °Location: °[facility], [address], [place],  [address] °Clinic Hours: By appointment only. °Monday - Thursday 8am-5pm, Friday 8am-12pm °Services: Cleanings, fillings, extractions. °Payment Options: °PAYMENT IS DUE AT THE TIME OF SERVICE. °Cash, Visa or MasterCard. Sliding scale - $30 minimum per service. °Best way to get seen: °Come in to office, complete packet and make an appointment - need proof of income °or support monies for each household member and proof of [place] residence. °Usually takes about a month to get in. °  °  °[facility] °[phone] °  °Location: °[address]   [place] [facility] Hours: Walk-in Urgent Care Dental Services are offered Monday-Friday  mornings only. The numbers of emergencies accepted daily is limited to the number of providers available. Maximum 15 - Mondays, Wednesdays & Thursdays Maximum 10 - Tuesdays & Fridays Services: You do not need to be a [facility] resident to be seen for a dental emergency. Emergencies are defined as pain, swelling, abnormal bleeding, or dental trauma. Walkins will receive x-rays if needed. NOTE: Dental cleaning is not an emergency. Payment Options: PAYMENT IS DUE AT THE TIME OF SERVICE. Minimum co-pay is $40.00 for uninsured patients. Minimum co-pay is $3.00 for Medicaid with dental coverage. Dental Insurance is accepted and must be presented at time of visit. Medicare does not cover dental. Forms of payment: Cash, credit card, checks. Best way to get seen: If not previously registered with the clinic, walk-in dental registration begins at 7:15 am and is on a first come/first serve basis. If previously registered with the clinic, call to make an appointment.     [facility] [phone] [place] RESIDENTS ONLY   Location: [address] [place], [place] Clinic Hours: Mon-Thu 10a-2p Services: Extractions only! Payment Options: FREE (donations accepted) - bring proof of income or support Best way to get seen: Call and schedule an appointment OR come at 8am on the 1st Monday of every month (except for holidays) when it is first come/first served.     [facility] [phone]   Location: [address] [place], [place] Clinic Hours: Friday mornings Services, Payment Options, Best way to get seen: Call for info  Abscessed Tooth An abscessed tooth is an infection around your tooth. It may be caused by holes or damage to the tooth (cavity) or a dental disease. An abscessed tooth causes mild to very bad pain in and around the tooth. See your dentist right away if you have tooth or gum pain. HOME CARE  Take your medicine as told. Finish it even if you start to  feel better.  Do not drive after taking pain medicine.  Rinse your mouth (gargle) often with salt water ( teaspoon salt in 8 ounces of warm water).  Do not apply heat to the outside of your face. GET HELP RIGHT AWAY IF:   You have a temperature by mouth above 102 F (38.9 C), not controlled by medicine.  You have chills and a very bad headache.  You have problems breathing or swallowing.  Your mouth will not open.  You develop puffiness (swelling) on the neck or around the eye.  Your pain is not helped by medicine.  Your pain is getting worse instead of better. MAKE SURE YOU:   Understand these instructions.  Will watch your condition.  Will get help right away if you are not doing well or get worse. Document Released: 12/10/2007 Document Revised: 09/15/2011 Document Reviewed: 10/01/2010 [facility] Patient Information 2015 [place], [place]. This information is not intended to replace advice given to you by your health care provider. Make sure you discuss any questions you have with your health care provider.

## 2015-04-02 NOTE — ED Notes (Signed)
Pt presents with dental pain to right upper started last night.

## 2015-04-02 NOTE — ED Notes (Signed)
Pt with upper right tooth pain, cheek swollen and warm

## 2015-04-02 NOTE — ED Provider Notes (Signed)
[facility] Emergency Department Provider Note  ____________________________________________  Time seen: Approximately 4:31 PM  I have reviewed the triage vital signs and the nursing notes.   HISTORY  Chief Complaint Dental Pain    HPI Tiffany Hickman is a 34 y.o. female patient complaining of edema to the right lateral facial area. Patient states dental pain onset last night. Patient appeared edema increased while at  work so she left and came to the emergency room. Patient is denies any fever associated with this she denies any dysphagia. Patient is rating the pain as a 10 over 10. Patient stated no palliative measures taken for this complaint.  Past Medical History  Diagnosis Date  . Hypertension     There are no active problems to display for this patient.   History reviewed. No pertinent past surgical history.  Current Outpatient Rx  Name  Route  Sig  Dispense  Refill  . amoxicillin (AMOXIL) 500 MG capsule   Oral   Take 1 capsule (500 mg total) by mouth 3 (three) times daily.   30 capsule   0   . amoxicillin (AMOXIL) 500 MG tablet   Oral   Take 1 tablet (500 mg total) by mouth 2 (two) times daily.   20 tablet   0   . ibuprofen (ADVIL,MOTRIN) 800 MG tablet   Oral   Take 1 tablet (800 mg total) by mouth every 8 (eight) hours as needed.   30 tablet   0   . ibuprofen (ADVIL,MOTRIN) 800 MG tablet   Oral   Take 1 tablet (800 mg total) by mouth every 8 (eight) hours as needed for moderate pain.   15 tablet   0   . lidocaine (XYLOCAINE) 2 % solution   Mouth/Throat   Use as directed 20 mLs in the mouth or throat as needed for mouth pain.   100 mL   0   . traMADol (ULTRAM) 50 MG tablet   Oral   Take 1 tablet (50 mg total) by mouth every 6 (six) hours as needed for moderate pain.   12 tablet   0     Allergies Review of patient's allergies indicates no known allergies.  Family History  Problem Relation Age of Onset  .  Diabetes Mother     Social History Social History  Substance Use Topics  . Smoking status: Never Smoker   . Smokeless tobacco: None  . Alcohol Use: No    Review of Systems Constitutional: No fever/chills Eyes: No visual changes. ENT: No sore throat. Dental pain Cardiovascular: Denies chest pain. Respiratory: Denies shortness of breath. Gastrointestinal: No abdominal pain.  No nausea, no vomiting.  No diarrhea.  No constipation. Genitourinary: Negative for dysuria. Musculoskeletal: Negative for back pain. Skin: Negative for rash. Neurological: Negative for headaches, focal weakness or numbness. Endocrine:Hypertension obesity 10-point ROS otherwise negative.  ____________________________________________   PHYSICAL EXAM:  VITAL SIGNS: ED Triage Vitals  Enc Vitals Group     BP 04/02/15 1624 182/102 mmHg     Pulse Rate 04/02/15 1624 73     Resp 04/02/15 1624 20     Temp 04/02/15 1624 98.5 F (36.9 C)     Temp Source 04/02/15 1624 Oral     SpO2 04/02/15 1624 98 %     Weight 04/02/15 1624 262 lb (118.842 kg)     Height 04/02/15 1624  (1.676 m)     Head Cir --      Peak Flow --  Pain Score --      Pain Loc --      Pain Edu? --      Excl. in GC? --     Constitutional: Alert and oriented. Well appearing and in no acute distress. Eyes: Conjunctivae are normal. PERRL. EOMI. Head: Atraumatic. Nose: No congestion/rhinnorhea. Mouth/Throat: Mucous membranes are moist.  Oropharynx non-erythematous. Neck: No stridor.   Hematological/Lymphatic/Immunilogical: No cervical lymphadenopathy. Cardiovascular: Normal rate, regular rhythm. Grossly normal heart sounds.  Good peripheral circulation. Respiratory: Normal respiratory effort.  No retractions. Lungs CTAB. Gastrointestinal: Soft and nontender. No distention. No abdominal bruits. No CVA tenderness. Musculoskeletal: No lower extremity tenderness nor edema.  No joint effusions. Neurologic:  Normal speech and language.  No gross focal neurologic deficits are appreciated. No gait instability. Skin:  Skin is warm, dry and intact. No rash noted. Psychiatric: Mood and affect are normal. Speech and behavior are normal.  ____________________________________________   LABS (all labs ordered are listed, but only abnormal results are displayed)  Labs Reviewed - No data to display ____________________________________________  EKG   ____________________________________________  RADIOLOGY   ____________________________________________   PROCEDURES  Procedure(s) performed: None  Critical Care performed: No  ________OPTIONS FOR DENTAL FOLLOW UP CARE  [place] Department of Health and Human Services - Local Safety Net Dental Clinics TripDoors.com.htm   [facility] [phone])  Sharl Ma [phone])  [place] [phone])  [facility] [phone])  [facility] [phone]) This clinic caters to the indigent population and is on a lottery system. Location: [employer] [facility] [employer], [address] [place] Clinic Hours: Wednesdays from 6pm - 9pm, patients seen by a lottery system. For dates, call or go to [email or web address] Services: Cleanings, fillings and simple extractions. Payment Options: DENTAL WORK IS FREE OF CHARGE. Bring proof of income or support. Best way to get seen: Arrive at 5:15 pm - this is a lottery, NOT first come/first serve, so arriving earlier will not increase your chances of being seen.     [facility] [phone] Select option 1 for emergencies   Location: [facility], [place], [address] [place] Clinic Hours: No walk-ins accepted - call the day before to schedule an appointment. Check in times are 9:30 am and 1:30 pm. Services: Simple  extractions, temporary fillings, pulpectomy/pulp debridement, uncomplicated abscess drainage. Payment Options: PAYMENT IS DUE AT THE TIME OF SERVICE.  Fee is usually $100-200, additional surgical procedures (e.g. abscess drainage) may be extra. Cash, checks, Visa/MasterCard accepted.  Can file Medicaid if patient is covered for dental - patient should call case worker to check. No discount for [facility] patients. Best way to get seen: MUST call the day before and get onto the schedule. Can usually be seen the next 1-2 days. No walk-ins accepted.     [facility] [phone]   Location: [facility], [address] [place] Clinic Hours: M, W, Th, F 8am or 1:30pm, Tues 9a or 1:30 - first come/first served. Services: Simple extractions, temporary fillings, uncomplicated abscess drainage.  You do not need to be an [facility] resident. Payment Options: PAYMENT IS DUE AT THE TIME OF SERVICE. Dental insurance, otherwise sliding scale - bring proof of income or support. Depending on income and treatment needed, cost is usually $50-200. Best way to get seen: Arrive early as it is first come/first served.     [facility]  [phone]   Location: [address] Clinic Hours: Mon-Thu 8a-5p Services: Most basic dental services including extractions and fillings. Payment Options: PAYMENT IS DUE AT THE TIME OF SERVICE. Sliding scale, up to 50% off - bring proof if income or support. Medicaid with dental option accepted. Best way to get seen: Call to schedule an appointment, can usually be seen within 2 weeks OR they will try to see walk-ins - show up at 8a or 2p (you may have to wait).     [facility] [phone] [place]IDENTS ONLY   Location: [facility], [address], [place], [place] [address] Clinic Hours: By appointment only. Monday -  Thursday 8am-5pm, Friday 8am-12pm Services: Cleanings, fillings, extractions. Payment Options: PAYMENT IS DUE AT THE TIME OF SERVICE. Cash, Visa or MasterCard. Sliding scale - $30 minimum per service. Best way to get seen: Come in to office, complete packet and make an appointment - need proof of income or support monies for each household member and proof of [facility] residence. Usually takes about a month to get in.     [facility] [phone]   Location: [address], [facility] Clinic Hours: Walk-in Urgent Care Dental Services are offered Monday-Friday mornings only. The numbers of emergencies accepted daily is limited to the number of providers available. Maximum 15 - Mondays, Wednesdays & Thursdays Maximum 10 - Tuesdays & Fridays Services: You do not need to be a [facility] resident to be seen for a dental emergency. Emergencies are defined as pain, swelling, abnormal bleeding, or dental trauma. Walkins will receive x-rays if needed. NOTE: Dental cleaning is not an emergency. Payment Options: PAYMENT IS DUE AT THE TIME OF SERVICE. Minimum co-pay is $40.00 for uninsured patients. Minimum co-pay is $3.00 for Medicaid with dental coverage. Dental Insurance is accepted and must be presented at time of visit. Medicare does not cover dental. Forms of payment: Cash, credit card, checks. Best way to get seen: If not previously registered with the clinic, walk-in dental registration begins at 7:15 am and is on a first come/first serve basis. If previously registered with the clinic, call to make an appointment.     [facility] [phone] [place] RESIDENTS ONLY   Location: [address], [place], [place] Clinic Hours: Mon-Thu 10a-2p Services: Extractions only! Payment Options: FREE (donations accepted) - bring proof of income or support Best way to get seen: Call and schedule an appointment OR come at 8am on the  1st Monday of every month (except for holidays) when it is first come/first served.     [facility] [phone]   Location: [address] [place], [place] Clinic Hours: Friday mornings Services, Payment Options, Best way to get seen: Call for info ____________________________________   INITIAL IMPRESSION / ASSESSMENT AND PLAN / ED COURSE  Pertinent labs & imaging results that were available during my care of the patient were reviewed by me and considered in my medical decision making (see chart for details).  Dental abscess right upper and lower .patient was started on Amoxil, ibuprofen, and tramadol. Patient advised to contact the dental clinic to schedule an appointment from the list provided. FINAL CLINICAL IMPRESSION(S) / ED DIAGNOSES  Final diagnoses:  Dental abscess      Joni Reining, PA-C 04/02/15 1648  Emily Filbert, MD 04/03/15 [phone]

## 2015-09-29 ENCOUNTER — Encounter: Payer: Self-pay | Admitting: Emergency Medicine

## 2015-09-29 ENCOUNTER — Emergency Department
Admission: EM | Admit: 2015-09-29 | Discharge: 2015-09-29 | Disposition: A | Discharge status: Home / Self Care | Payer: Medicaid Other | Attending: Emergency Medicine | Admitting: Emergency Medicine

## 2015-09-29 DIAGNOSIS — I10 Essential (primary) hypertension: Secondary | ICD-10-CM | POA: Diagnosis not present

## 2015-09-29 DIAGNOSIS — J029 Acute pharyngitis, unspecified: Secondary | ICD-10-CM | POA: Diagnosis present

## 2015-09-29 DIAGNOSIS — J020 Streptococcal pharyngitis: Secondary | ICD-10-CM

## 2015-09-29 LAB — POCT RAPID STREP A: STREPTOCOCCUS, GROUP A SCREEN (DIRECT): POSITIVE — AB

## 2015-09-29 MED ORDER — AMOXICILLIN 875 MG PO TABS: 875.0000 mg | ORAL_TABLET | Freq: Two times a day (BID) | ORAL | Status: DC

## 2015-09-29 MED ORDER — IBUPROFEN 600 MG PO TABS: 600.0000 mg | ORAL_TABLET | Freq: Four times a day (QID) | ORAL | Status: DC | PRN

## 2015-09-29 MED ORDER — MAGIC MOUTHWASH W/LIDOCAINE: 5.0000 mL | Freq: Four times a day (QID) | ORAL | Status: DC | PRN

## 2015-09-29 NOTE — ED Notes (Signed)
Awoke with sore throat this a.m.

## 2015-09-29 NOTE — Discharge Instructions (Signed)

## 2015-09-29 NOTE — ED Provider Notes (Signed)
[facility] Emergency Department Provider Note  ____________________________________________  Time seen: Approximately 9:37 AM  I have reviewed the triage vital signs and the nursing notes.   HISTORY  Chief Complaint Sore Throat    HPI Tiffany Hickman is a 35 y.o. female , NAD, presents to the emergency department with sudden onset sore throat this morning. Has had difficulty swallowing due to the pain. Denies fever, chills, body aches. No other upper respiratory symptoms such as nasal congestion, runny nose, cough, headache, ear pain. Has not taken anything over-the-counter for her current symptoms. States she has a history of strep infections in the past and this feels similar.Denies abdominal pain, nausea, vomiting. No rash.   Past Medical History  Diagnosis Date  . Hypertension     There are no active problems to display for this patient.   History reviewed. No pertinent past surgical history.  Current Outpatient Rx  Name  Route  Sig  Dispense  Refill  . amoxicillin (AMOXIL) 875 MG tablet   Oral   Take 1 tablet (875 mg total) by mouth 2 (two) times daily.   20 tablet   0   . ibuprofen (ADVIL,MOTRIN) 600 MG tablet   Oral   Take 1 tablet (600 mg total) by mouth every 6 (six) hours as needed.   30 tablet   0   . magic mouthwash w/lidocaine SOLN   Oral   Take 5 mLs by mouth 4 (four) times daily as needed for mouth pain.   240 mL   0     Please mix 80mL diphenhydramine, 80mL nystatin, 80 ...   . traMADol (ULTRAM) 50 MG tablet   Oral   Take 1 tablet (50 mg total) by mouth every 6 (six) hours as needed for moderate pain.   12 tablet   0     Allergies Review of patient's allergies indicates no known allergies.  Family History  Problem Relation Age of Onset  . Diabetes Mother     Social History Social History  Substance Use Topics  . Smoking status: Never Smoker   . Smokeless tobacco: None  . Alcohol Use: No     Review  of Systems  Constitutional: No fever/chills, fatigue Eyes: No visual changes. No discharge, redness, swelling ENT: Positive sore throat. No nasal congestion, ear pain, sinus pressure Cardiovascular: No chest pain. Respiratory: No cough. No shortness of breath. No wheezing.  Gastrointestinal: No abdominal pain.  No nausea, vomiting.  Musculoskeletal: Negative for general myalgias.  Skin: Negative for rash. Neurological: Negative for headaches, focal weakness or numbness. 10-point ROS otherwise negative.  ____________________________________________   PHYSICAL EXAM:  VITAL SIGNS: ED Triage Vitals  Enc Vitals Group     BP 09/29/15 0911 143/91 mmHg     Pulse Rate 09/29/15 0911 92     Resp 09/29/15 0911 20     Temp 09/29/15 0911 98.3 F (36.8 C)     Temp Source 09/29/15 0911 Oral     SpO2 09/29/15 0911 100 %     Weight 09/29/15 0911 268 lb (121.564 kg)     Height 09/29/15 0911  (1.676 m)     Head Cir --      Peak Flow --      Pain Score 09/29/15 0912 8     Pain Loc --      Pain Edu? --      Excl. in GC? --     Constitutional: Alert and oriented. Well appearing and  in no acute distress. Eyes: Conjunctivae are normal.  Head: Atraumatic. ENT:      Ears: TMs visualized bilaterally without erythema, effusion, bulging, perforation.      Nose: No congestion/rhinnorhea.      Mouth/Throat: Mucous membranes are moist. Pharynx with moderate erythema and bilateral tonsils with mild swelling. No exudates or posterior pharyngeal swelling. Neck: Supple with full range of motion. Hematological/Lymphatic/Immunilogical: Positive left anterior focal cervical lymphadenopathy without tenderness and all are mobile. Cardiovascular: Normal rate, regular rhythm. Normal S1 and S2.  Good peripheral circulation. Respiratory: Normal respiratory effort without tachypnea or retractions. Lungs CTAB with breath sounds noted throughout. Neurologic:  Normal speech and language. No gross focal neurologic  deficits are appreciated.  Skin:  Skin is warm, dry and intact. No rash noted. Psychiatric: Mood and affect are normal. Speech and behavior are normal. Patient exhibits appropriate insight and judgement.   ____________________________________________   LABS (all labs ordered are listed, but only abnormal results are displayed)  Labs Reviewed  POCT RAPID STREP A - Abnormal; Notable for the following:    Streptococcus, Group A Screen (Direct) POSITIVE (*)    All other components within normal limits   ____________________________________________  EKG  None ____________________________________________  RADIOLOGY  None ____________________________________________    PROCEDURES  Procedure(s) performed: None    Medications - No data to display   ____________________________________________   INITIAL IMPRESSION / ASSESSMENT AND PLAN / ED COURSE  Pertinent lab results that were available during my care of the patient were reviewed by me and considered in my medical decision making (see chart for details).  Patient's diagnosis is consistent with strep pharyngitis. Patient will be discharged home with prescriptions for amoxicillin, ibuprofen, Magic mouthwash to use as directed. Patient is to follow up with [facility] if symptoms persist past this treatment course. Patient is given ED precautions to return to the ED for any worsening or new symptoms.    ____________________________________________  FINAL CLINICAL IMPRESSION(S) / ED DIAGNOSES  Final diagnoses:  Strep pharyngitis      NEW MEDICATIONS STARTED DURING THIS VISIT:  New Prescriptions   AMOXICILLIN (AMOXIL) 875 MG TABLET    Take 1 tablet (875 mg total) by mouth 2 (two) times daily.   IBUPROFEN (ADVIL,MOTRIN) 600 MG TABLET    Take 1 tablet (600 mg total) by mouth every 6 (six) hours as needed.   MAGIC MOUTHWASH W/LIDOCAINE SOLN    Take 5  mLs by mouth 4 (four) times daily as needed for mouth pain.         Hope PigeonJami L Pammy Vesey, PA-C 09/29/15 [phone]  Jeanmarie PlantJames A McShane, MD 09/29/15 [phone]

## 2015-11-29 ENCOUNTER — Emergency Department: Payer: Medicaid Other

## 2015-11-29 ENCOUNTER — Emergency Department
Admission: EM | Admit: 2015-11-29 | Discharge: 2015-11-29 | Disposition: A | Discharge status: Home / Self Care | Payer: Medicaid Other | Attending: Emergency Medicine | Admitting: Emergency Medicine

## 2015-11-29 ENCOUNTER — Encounter: Payer: Self-pay | Admitting: Emergency Medicine

## 2015-11-29 DIAGNOSIS — M25562 Pain in left knee: Secondary | ICD-10-CM | POA: Insufficient documentation

## 2015-11-29 DIAGNOSIS — I10 Essential (primary) hypertension: Secondary | ICD-10-CM | POA: Insufficient documentation

## 2015-11-29 DIAGNOSIS — J3489 Other specified disorders of nose and nasal sinuses: Secondary | ICD-10-CM

## 2015-11-29 MED ORDER — IBUPROFEN 800 MG PO TABS: 800.0000 mg | ORAL_TABLET | Freq: Three times a day (TID) | ORAL | Status: DC

## 2015-11-29 NOTE — ED Notes (Signed)
Pt to ed with c/o left knee pain that started this am when she awoke.  Pt reports increased pain with ROM.

## 2015-11-29 NOTE — ED Notes (Signed)
States she woke up with pain and stiffness to left knee this am  Denies any injury  Ambulates well  Also developed a swollen gland to left side of face eysterday  No fever or sore throat  Area is tender on palpation

## 2015-11-29 NOTE — Discharge Instructions (Signed)
Follow-up with your [facility] if any continued problems or make an appointment with Dr. Martha ClanKrasinski. Begin taking ibuprofen 800 mg 3 times a day with food. Wear knee immobilizer when walking. Obtain over-the-counter decongestant/antihistamine such as Claritin-D or Zyrtec-D for your sinus drainage and congestion. Drink lots of water with this medication.

## 2015-11-29 NOTE — ED Provider Notes (Signed)
[facility] Emergency Department Provider Note   ____________________________________________  Time seen: Approximately 9:55 AM  I have reviewed the triage vital signs and the nursing notes.   HISTORY  Chief Complaint Knee Pain   HPI Earl Lagosyshekia N Karle is a 35 y.o. female is here with complaint of left knee pain that began this morning when she got out of bed. Patient states that it was very painful and felt stiff. She denies any recent injury to her knee and denies any previous knee problems. She has not taken any over-the-counter medication prior to arrival in the emergency room. She rates her pain as 5/10 and is ambulatory in the emergency room without assistance. She also complains of a swollen gland to the left side of her face since chest or day. She is unaware of any fever. She states that she does have some left ear pain. She denies any dental problems at this time.She occasionally does have a cough. She denies any throat pain or exposure to strep throat.     Past Medical History  Diagnosis Date  . Hypertension     There are no active problems to display for this patient.   History reviewed. No pertinent past surgical history.  Current Outpatient Rx  Name  Route  Sig  Dispense  Refill  . ibuprofen (ADVIL,MOTRIN) 800 MG tablet   Oral   Take 1 tablet (800 mg total) by mouth 3 (three) times daily.   30 tablet   0     Allergies Review of patient's allergies indicates no known allergies.  Family History  Problem Relation Age of Onset  . Diabetes Mother     Social History Social History  Substance Use Topics  . Smoking status: Never Smoker   . Smokeless tobacco: None  . Alcohol Use: No    Review of Systems Constitutional: No fever/chills ENT: No sore throat. Negative for ear pain. Cardiovascular: Denies chest pain. Respiratory: Denies shortness of breath. Gastrointestinal: No abdominal pain.  No nausea, no vomiting.     Musculoskeletal: Negative for back pain. Positive left knee pain. Skin: Negative for rash. Neurological: Negative for headaches, focal weakness or numbness. Hematological/Lymphatic:Positive swollen gland left neck  10-point ROS otherwise negative.  ____________________________________________   PHYSICAL EXAM:  VITAL SIGNS: ED Triage Vitals  Enc Vitals Group     BP 11/29/15 0924 143/86 mmHg     Pulse Rate 11/29/15 0924 89     Resp 11/29/15 0924 18     Temp 11/29/15 0924 97.6 F (36.4 C)     Temp Source 11/29/15 0924 Oral     SpO2 11/29/15 0924 100 %     Weight 11/29/15 0924 260 lb (117.935 kg)     Height 11/29/15 0924 5\' 6"  (1.676 m)     Head Cir --      Peak Flow --      Pain Score 11/29/15 0925 5     Pain Loc --      Pain Edu? --      Excl. in GC? --     Constitutional: Alert and oriented. Well appearing and in no acute distress. Eyes: Conjunctivae are normal. PERRL. EOMI. Head: Atraumatic. Nose: No congestion/rhinnorhea.  EACs and TMs are clear bilaterally. Mouth/Throat: Mucous membranes are moist.  Oropharynx non-erythematous.Moderate amount of posterior drainage is seen in throat does show minor cobblestoning. Neck: No stridor.  Supple. Hematological/Lymphatic/Immunilogical: Minimal cervical lymphadenopathy with left more tender than the right.. Cardiovascular: Normal rate, regular rhythm. Grossly  normal heart sounds.  Good peripheral circulation. Respiratory: Normal respiratory effort.  No retractions. Lungs CTAB. Musculoskeletal: Moves upper and lower extremities within normal limits with the exception of her left knee which patient is guarding. There is tenderness on palpation generalized on the anterior portion of the knee without any evidence of effusion or abnormality. Ligaments  stable bilaterally, no crepitus is noted. Neurologic:  Normal speech and language. No gross focal neurologic deficits are appreciated. No gait instability. Skin:  Skin is warm, dry and  intact. No rash noted. Psychiatric: Mood and affect are normal. Speech and behavior are normal.  ____________________________________________   LABS (all labs ordered are listed, but only abnormal results are displayed)  Labs Reviewed - No data to display __________________________________________  RADIOLOGY  Left knee x-ray per radiologist shows mild narrowing of the medial joint with tiny joint effusion. I, Tommi Rumps, personally viewed and evaluated these images (plain radiographs) as part of my medical decision making, as well as reviewing the written report by the radiologist. ____________________________________________   PROCEDURES  Procedure(s) performed: None  Critical Care performed: No  ____________________________________________   INITIAL IMPRESSION / ASSESSMENT AND PLAN / ED COURSE  Pertinent labs & imaging results that were available during my care of the patient were reviewed by me and considered in my medical decision making (see chart for details).  Patient was placed on ibuprofen 800 mg 3 times a day with food. Patient is follow-up with her primary care doctor at [facility] or see the orthopedist Dr. Martha Clan if any continued problems. She is aware that she needs to make an appointment. She is also placed in a knee immobilizer for added support. She was advised to obtain over-the-counter Claritin-D or Zyrtec-D for her sinus drainage. ____________________________________________   FINAL CLINICAL IMPRESSION(S) / ED DIAGNOSES  Final diagnoses:  Acute knee pain, left  Sinus drainage      NEW MEDICATIONS STARTED DURING THIS VISIT:  New Prescriptions   IBUPROFEN (ADVIL,MOTRIN) 800 MG TABLET    Take 1 tablet (800 mg total) by mouth 3 (three) times daily.     Note:  This document was prepared using Dragon voice recognition software and may include unintentional dictation errors.    Tommi Rumps, PA-C 11/29/15  1115  Arnaldo Natal, MD 11/29/15 [phone]

## 2016-02-24 ENCOUNTER — Encounter: Payer: Self-pay | Admitting: Emergency Medicine

## 2016-02-24 ENCOUNTER — Emergency Department
Admission: EM | Admit: 2016-02-24 | Discharge: 2016-02-24 | Disposition: A | Discharge status: Home / Self Care | Payer: Medicaid Other | Attending: Emergency Medicine | Admitting: Emergency Medicine

## 2016-02-24 DIAGNOSIS — Z5181 Encounter for therapeutic drug level monitoring: Secondary | ICD-10-CM | POA: Insufficient documentation

## 2016-02-24 DIAGNOSIS — F419 Anxiety disorder, unspecified: Secondary | ICD-10-CM | POA: Diagnosis not present

## 2016-02-24 DIAGNOSIS — I10 Essential (primary) hypertension: Secondary | ICD-10-CM | POA: Insufficient documentation

## 2016-02-24 DIAGNOSIS — Z791 Long term (current) use of non-steroidal anti-inflammatories (NSAID): Secondary | ICD-10-CM | POA: Insufficient documentation

## 2016-02-24 DIAGNOSIS — F410 Panic disorder [episodic paroxysmal anxiety] without agoraphobia: Secondary | ICD-10-CM | POA: Diagnosis present

## 2016-02-24 LAB — BASIC METABOLIC PANEL
Anion gap: 8 (ref 5–15)
BUN: 8 mg/dL (ref 6–20)
CALCIUM: 9.1 mg/dL (ref 8.9–10.3)
CO2: 23 mmol/L (ref 22–32)
CREATININE: 0.66 mg/dL (ref 0.44–1.00)
Chloride: 110 mmol/L (ref 101–111)
GFR calc Af Amer: >60 mL/min (ref >60)
GLUCOSE: 101 mg/dL — AB (ref 65–99)
Potassium: 3.5 mmol/L (ref 3.5–5.1)
SODIUM: 141 mmol/L (ref 135–145)

## 2016-02-24 LAB — URINALYSIS COMPLETE WITH MICROSCOPIC (ARMC ONLY)
BACTERIA UA: NONE SEEN
BILIRUBIN URINE: NEGATIVE
GLUCOSE, UA: NEGATIVE mg/dL
Ketones, ur: NEGATIVE mg/dL
Leukocytes, UA: NEGATIVE
NITRITE: NEGATIVE
Protein, ur: 100 mg/dL — AB
Specific Gravity, Urine: 1.027 (ref 1.005–1.030)
pH: 5 (ref 5.0–8.0)

## 2016-02-24 LAB — URINE DRUG SCREEN, QUALITATIVE (ARMC ONLY)
Amphetamines, Ur Screen: NOT DETECTED
Barbiturates, Ur Screen: NOT DETECTED
Benzodiazepine, Ur Scrn: POSITIVE — AB
CANNABINOID 50 NG, UR ~~LOC~~: NOT DETECTED
Cocaine Metabolite,Ur ~~LOC~~: NOT DETECTED
MDMA (Ecstasy)Ur Screen: NOT DETECTED
Methadone Scn, Ur: NOT DETECTED
OPIATE, UR SCREEN: NOT DETECTED
PHENCYCLIDINE (PCP) UR S: NOT DETECTED
Tricyclic, Ur Screen: NOT DETECTED

## 2016-02-24 LAB — CBC WITH DIFFERENTIAL/PLATELET
Basophils Absolute: 0.1 10*3/uL (ref 0–0.1)
Basophils Relative: 1 %
EOS ABS: 0.3 10*3/uL (ref 0–0.7)
EOS PCT: 3 %
HCT: 45.2 % (ref 35.0–47.0)
Hemoglobin: 15 g/dL (ref 12.0–16.0)
LYMPHS ABS: 2 10*3/uL (ref 1.0–3.6)
Lymphocytes Relative: 20 %
MCH: 29 pg (ref 26.0–34.0)
MCHC: 33.2 g/dL (ref 32.0–36.0)
MCV: 87.3 fL (ref 80.0–100.0)
MONO ABS: 0.4 10*3/uL (ref 0.2–0.9)
MONOS PCT: 4 %
Neutro Abs: 7.4 10*3/uL — ABNORMAL HIGH (ref 1.4–6.5)
Neutrophils Relative %: 72 %
PLATELETS: 314 10*3/uL (ref 150–440)
RBC: 5.18 MIL/uL (ref 3.80–5.20)
RDW: 14.5 % (ref 11.5–14.5)
WBC: 10.1 10*3/uL (ref 3.6–11.0)

## 2016-02-24 LAB — ETHANOL: ALCOHOL ETHYL (B): 82 mg/dL — AB (ref <5)

## 2016-02-24 LAB — PREGNANCY, URINE: Preg Test, Ur: NEGATIVE

## 2016-02-24 MED ORDER — IBUPROFEN 800 MG PO TABS: 800.0000 mg | ORAL_TABLET | Freq: Once | ORAL | Status: DC

## 2016-02-24 MED ORDER — IBUPROFEN 800 MG PO TABS
ORAL_TABLET | ORAL | Status: AC
  Filled 2016-02-24: qty 1

## 2016-02-24 MED ORDER — SODIUM CHLORIDE 0.9 % IV BOLUS (SEPSIS)
1000.0000 mL | Freq: Once | INTRAVENOUS | Status: AC
  Administered 2016-02-24: 1000 mL via INTRAVENOUS

## 2016-02-24 NOTE — ED Provider Notes (Signed)
[facility] Emergency Department Provider Note   ____________________________________________   First MD Initiated Contact with Patient 02/24/16 0142     (approximate)  I have reviewed the triage vital signs and the nursing notes.   HISTORY  Chief Complaint Panic Attack  Limited by somnolence  HPI Tiffany Hickman is a 35 y.o. female brought to the ED from home via EMS for "panic attack". EMS reports patient was screaming with her muscles clenched,hyperventilating, complaining of pain to her bilateral hands. She was given 2 mg of IM Versed prior to arrival. At the time of my exam, patient is sleeping soundly and difficult to keep awake for interview.   Past Medical History:  Diagnosis Date  . Hypertension     There are no active problems to display for this patient.   History reviewed. No pertinent surgical history.  Prior to Admission medications   Medication Sig Start Date End Date Taking? Authorizing Provider  ibuprofen (ADVIL,MOTRIN) 800 MG tablet Take 1 tablet (800 mg total) by mouth 3 (three) times daily. 11/29/15   Tommi Rumpshonda L Summers, PA-C    Allergies Review of patient's allergies indicates no known allergies.  Family History  Problem Relation Age of Onset  . Diabetes Mother     Social History Social History  Substance Use Topics  . Smoking status: Never Smoker  . Smokeless tobacco: Never Used  . Alcohol use Yes    Review of Systems  Constitutional: No fever/chills. Eyes: No visual changes. ENT: No sore throat. Cardiovascular: Denies chest pain. Respiratory: Denies shortness of breath. Gastrointestinal: No abdominal pain.  No nausea, no vomiting.  No diarrhea.  No constipation. Genitourinary: Negative for dysuria. Musculoskeletal: Negative for back pain. Skin: Negative for rash. Neurological: Negative for headaches, focal weakness or numbness. Psychiatric:Positive for anxiety and panic attack.  10-point ROS  otherwise negative.  ____________________________________________   PHYSICAL EXAM:  VITAL SIGNS: ED Triage Vitals  Enc Vitals Group     BP 02/24/16 0050 (!) 153/103     Pulse Rate 02/24/16 0050 94     Resp 02/24/16 0050 14     Temp 02/24/16 0050 98 F (36.7 C)     Temp Source 02/24/16 0050 Oral     SpO2 02/24/16 0050 100 %     Weight 02/24/16 0048 268 lb (121.6 kg)     Height 02/24/16 0048 5\' 6"  (1.676 m)     Head Circumference --      Peak Flow --      Pain Score 02/24/16 0049 4     Pain Loc --      Pain Edu? --      Excl. in GC? --     Constitutional: Asleep, difficult to keep awake. Well appearing and in no acute distress. Eyes: Conjunctivae are normal. PERRL. EOMI. Head: Atraumatic. Nose: No congestion/rhinnorhea. Mouth/Throat: Mucous membranes are moist.  Oropharynx non-erythematous. Neck: No stridor.  No cervical spine tenderness to palpation.  No step-offs or deformities. Cardiovascular: Normal rate, regular rhythm. Grossly normal heart sounds.  Good peripheral circulation. Respiratory: Normal respiratory effort.  No retractions. Lungs CTAB. Gastrointestinal: Soft and nontender. No distention. No abdominal bruits. No CVA tenderness. Musculoskeletal: No lower extremity tenderness nor edema.  No joint effusions. Neurologic:  Sleeping soundly. Difficult to assess.  Skin:  Skin is warm, dry and intact. No rash noted. Psychiatric: Unable to assess secondary to sleeping.  ____________________________________________   LABS (all labs ordered are listed, but only abnormal results are displayed)  Labs Reviewed  CBC WITH DIFFERENTIAL/PLATELET - Abnormal; Notable for the following:       Result Value   Neutro Abs 7.4 (*)    All other components within normal limits  BASIC METABOLIC PANEL - Abnormal; Notable for the following:    Glucose, Bld 101 (*)    All other components within normal limits  ETHANOL - Abnormal; Notable for the following:    Alcohol, Ethyl (B) 82  (*)    All other components within normal limits  URINE DRUG SCREEN, QUALITATIVE ([facility] ONLY) - Abnormal; Notable for the following:    Benzodiazepine, Ur Scrn POSITIVE (*)    All other components within normal limits  URINALYSIS COMPLETEWITH MICROSCOPIC ([facility] ONLY) - Abnormal; Notable for the following:    Color, Urine YELLOW (*)    APPearance HAZY (*)    Hgb urine dipstick 3+ (*)    Protein, ur 100 (*)    Squamous Epithelial / LPF 0-5 (*)    All other components within normal limits  POC URINE PREG, ED   ____________________________________________  EKG  ED ECG REPORT I, SUNG,JADE J, the attending physician, personally viewed and interpreted this ECG.   Date: 02/24/2016  EKG Time: 0051  Rate: 93  Rhythm: normal EKG, normal sinus rhythm  Axis: Normal  Intervals:none  ST&T Change: Nonspecific  ____________________________________________  RADIOLOGY  None ____________________________________________   PROCEDURES  Procedure(s) performed: None  Procedures  Critical Care performed: No  ____________________________________________   INITIAL IMPRESSION / ASSESSMENT AND PLAN / ED COURSE  Pertinent labs & imaging results that were available during my care of the patient were reviewed by me and considered in my medical decision making (see chart for details).  35 year old female brought to the ED for panic attack. She was reportedly screaming with muscles clenched and therefore received IM calming medications per EMS. She is unable to participate in interview and exam secondary to sleeping from sedatives. Will attempt to find a family member to offer further history. Will check screening lab work including toxicological screen.  Clinical Course  Comment By Time  Awaiting urine results. Patient did wake up as well as have family by bedside. Reports long history of anxiety and panic attacks. States she has been stressed and overwhelmed recently and had a panic attack  overnight. She was tearful but denied SI/HI/AH/VH. Decline offered to speak with behavioral medicine counselor. Irean HongJade J Sung, MD 08/20 [phone]  Patient resting in no acute distress feeling much better. Has been up and ambulating to the restroom. Results noted. Patient on period. Will discharge home with her sister. Strict return precautions given. Patient verbalizes understanding and agrees with plan of care. Irean HongJade J Sung, MD 08/20 [phone]     ____________________________________________   FINAL CLINICAL IMPRESSION(S) / ED DIAGNOSES  Final diagnoses:  Anxiety  Panic attacks      NEW MEDICATIONS STARTED DURING THIS VISIT:  New Prescriptions   No medications on file     Note:  This document was prepared using Dragon voice recognition software and may include unintentional dictation errors.    Irean HongJade J Sung, MD 02/24/16 [phone]

## 2016-02-24 NOTE — ED Notes (Signed)
Baby dady leaving bedside; call Gala Murdochanisha (sister) for ride [phone]

## 2016-02-24 NOTE — Discharge Instructions (Signed)
Return to the ER for worsening symptoms, feelings of hurting yourself or others, or other concerns. 

## 2016-02-24 NOTE — ED Notes (Signed)
Pt alert and oriented X4, active, cooperative, pt in NAD. RR even and unlabored, color WNL.  Pt informed to return if any life threatening symptoms occur.  Pt father here to take her home.

## 2016-02-24 NOTE — ED Notes (Signed)
Phone given to pt - daughter calling

## 2016-02-24 NOTE — ED Notes (Signed)
Attempted to call pt's sister; no answer

## 2016-02-24 NOTE — ED Triage Notes (Signed)
Pt to rm 19 via EMS from home, ems report pt had very intense panic attack, was screaming, muscles clenched, pt now c/o of pain to hands and HA.  EMS attempted to calm down pt through breathing techniques and o2, unsuccessful, EMS gave 2 versed IM.  Pt calm at this time.

## 2016-03-17 ENCOUNTER — Emergency Department
Admission: EM | Admit: 2016-03-17 | Discharge: 2016-03-17 | Disposition: A | Discharge status: Home / Self Care | Payer: Medicaid Other | Attending: Emergency Medicine | Admitting: Emergency Medicine

## 2016-03-17 ENCOUNTER — Encounter: Payer: Self-pay | Admitting: Emergency Medicine

## 2016-03-17 DIAGNOSIS — I10 Essential (primary) hypertension: Secondary | ICD-10-CM | POA: Insufficient documentation

## 2016-03-17 DIAGNOSIS — J069 Acute upper respiratory infection, unspecified: Secondary | ICD-10-CM | POA: Diagnosis not present

## 2016-03-17 DIAGNOSIS — Z79899 Other long term (current) drug therapy: Secondary | ICD-10-CM | POA: Diagnosis not present

## 2016-03-17 DIAGNOSIS — K047 Periapical abscess without sinus: Secondary | ICD-10-CM | POA: Insufficient documentation

## 2016-03-17 DIAGNOSIS — F17210 Nicotine dependence, cigarettes, uncomplicated: Secondary | ICD-10-CM | POA: Diagnosis not present

## 2016-03-17 DIAGNOSIS — R05 Cough: Secondary | ICD-10-CM | POA: Diagnosis present

## 2016-03-17 MED ORDER — AMOXICILLIN 500 MG PO CAPS: 500.0000 mg | ORAL_CAPSULE | Freq: Three times a day (TID) | ORAL | 0 refills | Status: DC

## 2016-03-17 MED ORDER — BENZONATATE 100 MG PO CAPS: 200.0000 mg | ORAL_CAPSULE | Freq: Three times a day (TID) | ORAL | 0 refills | Status: AC | PRN

## 2016-03-17 NOTE — ED Notes (Signed)
States she developed a non prod cough about 4 days ago   With some occasional wheezing   Unsure of fever  yesterday noticed some swelling to right side of face and pain moves into ear

## 2016-03-17 NOTE — ED Provider Notes (Signed)
[facility] Emergency Department Provider Note  ____________________________________________   First MD Initiated Contact with Patient 03/17/16 1007     (approximate)  I have reviewed the triage vital signs and the nursing notes.   HISTORY  Chief Complaint Abscess and Cough   HPI Tiffany Hickman is a 35 y.o. female is here with complaint of dental pain for several days. Patient also developed a cough for the last 2 days. She is unaware of any fever. She describes her cough as nonproductive with an occasional wheeze heard. Patient denies any previous history of asthma. Patient is been using cough drops at home without any relief of her symptoms. Currently she rates her pain as 5/10.   Past Medical History:  Diagnosis Date  . Hypertension     There are no active problems to display for this patient.   History reviewed. No pertinent surgical history.  Prior to Admission medications   Medication Sig Start Date End Date Taking? Authorizing Provider  hydrochlorothiazide (HYDRODIURIL) 25 MG tablet Take 25 mg by mouth daily.   Yes Historical Provider, MD  amoxicillin (AMOXIL) 500 MG capsule Take 1 capsule (500 mg total) by mouth 3 (three) times daily. 03/17/16   Tommi Rumpshonda L Summers, PA-C  benzonatate (TESSALON PERLES) 100 MG capsule Take 2 capsules (200 mg total) by mouth 3 (three) times daily as needed for cough. 03/17/16 03/17/17  Tommi Rumpshonda L Summers, PA-C  ibuprofen (ADVIL,MOTRIN) 800 MG tablet Take 1 tablet (800 mg total) by mouth 3 (three) times daily. 11/29/15   Tommi Rumpshonda L Summers, PA-C    Allergies Review of patient's allergies indicates no known allergies.  Family History  Problem Relation Age of Onset  . Diabetes Mother     Social History Social History  Substance Use Topics  . Smoking status: Never Smoker  . Smokeless tobacco: Never Used  . Alcohol use Yes    Review of Systems Constitutional: Unaware of fever/chills Eyes: No visual  changes. ENT: No sore throat. Positive dental pain. Cardiovascular: Denies chest pain. Respiratory: Denies shortness of breath. Positive nonproductive cough. Positive occasional wheeze. Gastrointestinal: No abdominal pain.  No nausea, no vomiting.  Musculoskeletal: Negative for back pain. Skin: Negative for rash. Neurological: Negative for headaches, focal weakness or numbness.  10-point ROS otherwise negative.  ____________________________________________   PHYSICAL EXAM:  VITAL SIGNS: ED Triage Vitals  Enc Vitals Group     BP 03/17/16 0912 (!) 145/76     Pulse Rate 03/17/16 0912 96     Resp 03/17/16 0912 18     Temp 03/17/16 0912 98.4 F (36.9 C)     Temp Source 03/17/16 0912 Oral     SpO2 03/17/16 0912 99 %     Weight 03/17/16 0913 260 lb (117.9 kg)     Height 03/17/16 0913 5\' 6"  (1.676 m)     Head Circumference --      Peak Flow --      Pain Score 03/17/16 0930 5     Pain Loc --      Pain Edu? --      Excl. in GC? --     Constitutional: Alert and oriented. Well appearing and in no acute distress. Eyes: Conjunctivae are normal. PERRL. EOMI. Head: Atraumatic. Nose: Mild congestion/rhinnorhea. Mouth/Throat: Mucous membranes are moist.  Oropharynx non-erythematous.Right upper molar with poor dental hygiene and repair. There is a molar protruding from the gum which is chronically in this repair. No drainage is noted. Area is tender on palpation  with a tongue depressor. Neck: No stridor.   Hematological/Lymphatic/Immunilogical: No cervical lymphadenopathy. Cardiovascular: Normal rate, regular rhythm. Grossly normal heart sounds.  Good peripheral circulation. Respiratory: Normal respiratory effort.  No retractions. Lungs CTAB. No wheezes were heard. Musculoskeletal: No lower extremity tenderness nor edema.  No joint effusions. Neurologic:  Normal speech and language. No gross focal neurologic deficits are appreciated. No gait instability. Skin:  Skin is warm, dry and  intact. No rash noted. Psychiatric: Mood and affect are normal. Speech and behavior are normal.  ____________________________________________   LABS (all labs ordered are listed, but only abnormal results are displayed)  Labs Reviewed - No data to display   PROCEDURES  Procedure(s) performed: None  Procedures  Critical Care performed: No  ____________________________________________   INITIAL IMPRESSION / ASSESSMENT AND PLAN / ED COURSE  Pertinent labs & imaging results that were available during my care of the patient were reviewed by me and considered in my medical decision making (see chart for details).    Clinical Course   Patient was given a prescription for amoxicillin. Patient is continue taking over-the-counter cold medication that she has a home. She was also given a prescription for Tessalon Perles 1 or 2 every 8 hours as needed for cough. She is to contact her dentist for an appointment. She is to contact [facility] if any continued problems.  ____________________________________________   FINAL CLINICAL IMPRESSION(S) / ED DIAGNOSES  Final diagnoses:  Dental abscess  URI (upper respiratory infection)  Cigarette smoker      NEW MEDICATIONS STARTED DURING THIS VISIT:  Discharge Medication List as of 03/17/2016 10:30 AM    START taking these medications   Details  amoxicillin (AMOXIL) 500 MG capsule Take 1 capsule (500 mg total) by mouth 3 (three) times daily., Starting Mon 03/17/2016, Print    benzonatate (TESSALON PERLES) 100 MG capsule Take 2 capsules (200 mg total) by mouth 3 (three) times daily as needed for cough., Starting Mon 03/17/2016, Until Tue 03/17/2017, Print         Note:  This document was prepared using Dragon voice recognition software and may include unintentional dictation errors.    Tommi Rumps, PA-C 03/17/16 1332    Emily Filbert, MD 03/17/16 [phone]

## 2016-03-17 NOTE — ED Triage Notes (Signed)
States upper R dental pain today, cough x 2 days.

## 2016-03-17 NOTE — Discharge Instructions (Signed)
Follow-up with your primary care doctor if any continued problems. Call your dentist today and make an appointment. Begin taking medication as directed. Amoxicillin 3 times a day for the next 10 days. Tessalon Perles 1 or 2 every 8 hours as needed for cough. Continue with over-the-counter cold medication. You may also get Zyrtec or Claritin for nasal congestion. Increase fluids. Take Tylenol or ibuprofen if needed for pain.

## 2016-07-21 ENCOUNTER — Encounter: Payer: Self-pay | Admitting: *Deleted

## 2016-07-22 ENCOUNTER — Ambulatory Visit: Payer: Medicaid Other | Admitting: Internal Medicine

## 2016-07-22 ENCOUNTER — Encounter: Payer: Self-pay | Admitting: *Deleted

## 2016-07-22 NOTE — Progress Notes (Deleted)
New Outpatient Visit Date: 07/22/2016  Referring Provider: [facility] [address] [place], [place] [address]  Chief Complaint: ***  HPI:  [name] is a 36 y.o. year-old female with history of hypertension, who has been referred by Dr. Eli Hickman for ***.  --------------------------------------------------------------------------------------------------  Cardiovascular History & Procedures: Cardiovascular Problems:  ***  Risk Factors:  ***  Cath/PCI:  ***  CV Surgery:  ***  EP Procedures and Devices:  ***  Non-Invasive Evaluation(s):  ***  Recent CV Pertinent Labs: Lab Results  Component Value Date   K 3.5 02/24/2016   K 3.7 12/29/2011   BUN 8 02/24/2016   BUN 7 12/29/2011   CREATININE 0.66 02/24/2016   CREATININE 0.66 12/29/2011    --------------------------------------------------------------------------------------------------  Past Medical History:  Diagnosis Date  . Abscess, peritonsillar   . Back pain   . Cubital tunnel syndrome   . DOE (dyspnea on exertion)   . Hidradenitis suppurativa   . Hypertension   . Hypertension   . Patellar tendinitis   . Prediabetes   . Unspecified disorder of binocular vision     Past Surgical History:  Procedure Laterality Date  . SKIN GRAFT     LEFT FOOT    Outpatient Encounter Prescriptions as of 07/22/2016  Medication Sig  . amoxicillin (AMOXIL) 500 MG capsule Take 1 capsule (500 mg total) by mouth 3 (three) times daily.  . benzonatate (TESSALON PERLES) 100 MG capsule Take 2 capsules (200 mg total) by mouth 3 (three) times daily as needed for cough.  . hydrochlorothiazide (HYDRODIURIL) 25 MG tablet Take 25 mg by mouth daily.  . metFORMIN (GLUCOPHAGE) 500 MG tablet Take 500 mg by mouth daily with breakfast.  . pyridOXINE (VITAMIN B-6) 100 MG tablet Take 100 mg by mouth daily.   No facility-administered encounter medications on file as of 07/22/2016.     Allergies: Patient has  no known allergies.  Social History   Social History  . Marital status: Single    Spouse name: N/A  . Number of children: N/A  . Years of education: N/A   Occupational History  . Not on file.   Social History Main Topics  . Smoking status: Former Smoker    Years: 1.00  . Smokeless tobacco: Never Used  . Alcohol use No  . Drug use: No  . Sexual activity: Not on file   Other Topics Concern  . Not on file   Social History Narrative  . No narrative on file    Family History  Problem Relation Age of Onset  . Diabetes Mother   . Hypertension Mother   . Hypertension Father   . COPD Father     Review of Systems: A 12-system review of systems was performed and was negative except as noted in the HPI.  --------------------------------------------------------------------------------------------------  Physical Exam: There were no vitals taken for this visit.  General:  *** HEENT: No conjunctival pallor or scleral icterus.  Moist mucous membranes.  OP clear. Neck: Supple without lymphadenopathy, thyromegaly, JVD, or HJR.  No carotid bruit. Lungs: Normal work of breathing.  Clear to auscultation bilaterally without wheezes or crackles. Heart: Regular rate and rhythm without murmurs, rubs, or gallops.  Non-displaced PMI. Abd: Bowel sounds present.  Soft, NT/ND without hepatosplenomegaly Ext: No lower extremity edema.  Radial, PT, and DP pulses are 2+ bilaterally Skin: warm and dry without rash Neuro: CNIII-XII intact.  Strength and fine-touch sensation intact in upper and lower extremities bilaterally. Psych: Normal mood and affect.  EKG:  ***  Lab Results  Component Value Date   WBC 10.1 02/24/2016   HGB 15.0 02/24/2016   HCT 45.2 02/24/2016   MCV 87.3 02/24/2016   PLT 314 02/24/2016    Lab Results  Component Value Date   NA 141 02/24/2016   K 3.5 02/24/2016   CL 110 02/24/2016   CO2 23 02/24/2016   BUN 8 02/24/2016   CREATININE 0.66 02/24/2016   GLUCOSE  101 (H) 02/24/2016    No results found for: CHOL, HDL, LDLCALC, LDLDIRECT, TRIG, CHOLHDL   --------------------------------------------------------------------------------------------------  ASSESSMENT AND PLAN: ***  Tiffany Hickman Tiffany Mathisen, MD 07/22/2016 8:30 AM

## 2016-07-23 ENCOUNTER — Ambulatory Visit: Payer: Medicaid Other | Admitting: Internal Medicine

## 2017-08-31 ENCOUNTER — Encounter: Payer: Self-pay | Admitting: Emergency Medicine

## 2017-08-31 ENCOUNTER — Emergency Department
Admission: EM | Admit: 2017-08-31 | Discharge: 2017-08-31 | Disposition: A | Discharge status: Home / Self Care | Payer: Medicaid Other | Attending: Emergency Medicine | Admitting: Emergency Medicine

## 2017-08-31 ENCOUNTER — Other Ambulatory Visit: Payer: Self-pay

## 2017-08-31 DIAGNOSIS — Z87891 Personal history of nicotine dependence: Secondary | ICD-10-CM | POA: Insufficient documentation

## 2017-08-31 DIAGNOSIS — N3001 Acute cystitis with hematuria: Secondary | ICD-10-CM | POA: Diagnosis not present

## 2017-08-31 DIAGNOSIS — R300 Dysuria: Secondary | ICD-10-CM | POA: Diagnosis present

## 2017-08-31 DIAGNOSIS — R7303 Prediabetes: Secondary | ICD-10-CM | POA: Diagnosis not present

## 2017-08-31 DIAGNOSIS — Z79899 Other long term (current) drug therapy: Secondary | ICD-10-CM | POA: Diagnosis not present

## 2017-08-31 DIAGNOSIS — I10 Essential (primary) hypertension: Secondary | ICD-10-CM | POA: Insufficient documentation

## 2017-08-31 DIAGNOSIS — M25511 Pain in right shoulder: Secondary | ICD-10-CM

## 2017-08-31 LAB — URINALYSIS, COMPLETE (UACMP) WITH MICROSCOPIC
BILIRUBIN URINE: NEGATIVE
GLUCOSE, UA: NEGATIVE mg/dL
KETONES UR: NEGATIVE mg/dL
Nitrite: NEGATIVE
PROTEIN: 30 mg/dL — AB
Specific Gravity, Urine: 1.018 (ref 1.005–1.030)
pH: 5 (ref 5.0–8.0)

## 2017-08-31 MED ORDER — CYCLOBENZAPRINE HCL 10 MG PO TABS: 10.0000 mg | ORAL_TABLET | Freq: Three times a day (TID) | ORAL | 0 refills | Status: DC | PRN

## 2017-08-31 MED ORDER — SULFAMETHOXAZOLE-TRIMETHOPRIM 800-160 MG PO TABS: 1.0000 | ORAL_TABLET | Freq: Two times a day (BID) | ORAL | 0 refills | Status: DC

## 2017-08-31 MED ORDER — PHENAZOPYRIDINE HCL 200 MG PO TABS: 200.0000 mg | ORAL_TABLET | Freq: Three times a day (TID) | ORAL | 0 refills | Status: DC | PRN

## 2017-08-31 NOTE — ED Provider Notes (Signed)
[facility] Emergency Department Provider Note  ____________________________________________  Time seen: Approximately 12:55 PM  I have reviewed the triage vital signs and the nursing notes.   HISTORY  Chief Complaint Dysuria    HPI Tiffany Hickman is a 37 y.o. female who presents to the emergency department for treatment and evaluation of urinary frequency and dysuria for the past couple of days.  She denies vaginal discharge or fever.  She denies abdominal pain.  She does have occasional bilateral lower back pain.  She also complains of right shoulder pain that started approximately 2 weeks ago and is much worse at night when she is trying to sleep.  Pain in the shoulder is relieved by readjusting.  She has attempted to take ibuprofen without much relief.  She did take somebody else's muscle relaxer and pain medication with relief.  Past Medical History:  Diagnosis Date  . Abscess, peritonsillar   . Back pain   . Cubital tunnel syndrome   . DOE (dyspnea on exertion)   . Hidradenitis suppurativa   . Hypertension   . Hypertension   . Patellar tendinitis   . Prediabetes   . Unspecified disorder of binocular vision     There are no active problems to display for this patient.   Past Surgical History:  Procedure Laterality Date  . SKIN GRAFT     LEFT FOOT    Prior to Admission medications   Medication Sig Start Date End Date Taking? Authorizing Provider  amoxicillin (AMOXIL) 500 MG capsule Take 1 capsule (500 mg total) by mouth 3 (three) times daily. 03/17/16   Tommi Rumps, PA-C  cyclobenzaprine (FLEXERIL) 10 MG tablet Take 1 tablet (10 mg total) by mouth 3 (three) times daily as needed for muscle spasms. 08/31/17   Jessika Rothery B, FNP  hydrochlorothiazide (HYDRODIURIL) 25 MG tablet Take 25 mg by mouth daily.    [provider]  metFORMIN (GLUCOPHAGE) 500 MG tablet Take 500 mg by mouth daily with breakfast.    [provider]  phenazopyridine (PYRIDIUM) 200 MG tablet Take 1 tablet (200 mg total) by mouth 3 (three) times daily as needed for pain. 08/31/17   Seena Face, Rulon Eisenmenger B, FNP  pyridOXINE (VITAMIN B-6) 100 MG tablet Take 100 mg by mouth daily.    [provider]  sulfamethoxazole-trimethoprim (BACTRIM DS,SEPTRA DS) 800-160 MG tablet Take 1 tablet by mouth 2 (two) times daily. 08/31/17   Adama Ferber, Rulon Eisenmenger B, FNP  loratadine (CLARITIN) 10 MG tablet Take 1 tablet (10 mg total) by mouth daily. 01/02/15 03/18/15  Garrel Ridgel, PA-C    Allergies Patient has no known allergies.  Family History  Problem Relation Age of Onset  . Diabetes Mother   . Hypertension Mother   . Hypertension Father   . COPD Father     Social History Social History   Tobacco Use  . Smoking status: Former Smoker    Years: 1.00  . Smokeless tobacco: Never Used  Substance Use Topics  . Alcohol use: No  . Drug use: No    Review of Systems Constitutional: Negative for fever. Respiratory: Negative for shortness of breath or cough. Gastrointestinal: Negative for abdominal pain; negative for nausea , negative for vomiting. Genitourinary: Positive for dysuria , negative for vaginal discharge. Musculoskeletal: Positive for back pain.  Positive for right shoulder pain Skin: Negative for rash, lesion, or wound. ____________________________________________   PHYSICAL EXAM:  VITAL SIGNS: ED Triage Vitals  Enc Vitals Group  BP 08/31/17 1124 120/73     Pulse Rate 08/31/17 1124 77     Resp 08/31/17 1124 20     Temp 08/31/17 1124 98 F (36.7 C)     Temp Source 08/31/17 1124 Oral     SpO2 08/31/17 1124 98 %     Weight 08/31/17 1125 263 lb (119.3 kg)     Height 08/31/17 1125 5\' 6"  (1.676 m)     Head Circumference --      Peak Flow --      Pain Score 08/31/17 1125 10     Pain Loc --      Pain Edu? --      Excl. in GC? --     Constitutional: Alert and oriented. Well appearing and in no acute distress. Eyes:  Conjunctivae are normal. PERRL. EOMI. Head: Atraumatic. Nose: No congestion/rhinnorhea. Mouth/Throat: Mucous membranes are moist. Respiratory: Normal respiratory effort.  No retractions. Gastrointestinal: Abdomen is soft, nontender, no rebound or guarding.  Mild suprapubic tenderness on exam.   Genitourinary: Pelvic exam: Not indicated Musculoskeletal: Patient demonstrates active, full range of motion of the right shoulder. Neurologic:  Normal speech and language. No gross focal neurologic deficits are appreciated. Speech is normal. No gait instability. Skin:  Skin is warm, dry and intact. No rash noted. Psychiatric: Mood and affect are normal. Speech and behavior are normal.  ____________________________________________   LABS (all labs ordered are listed, but only abnormal results are displayed)  Labs Reviewed  URINALYSIS, COMPLETE (UACMP) WITH MICROSCOPIC - Abnormal; Notable for the following components:      Result Value   Color, Urine YELLOW (*)    APPearance CLOUDY (*)    Hgb urine dipstick MODERATE (*)    Protein, ur 30 (*)    Leukocytes, UA MODERATE (*)    Bacteria, UA RARE (*)    Squamous Epithelial / LPF 6-30 (*)    Non Squamous Epithelial 0-5 (*)    All other components within normal limits   ____________________________________________  RADIOLOGY  Not indicated ____________________________________________   PROCEDURES  Procedure(s) performed: None  ____________________________________________  37 year old female presenting to the emergency department for treatment and evaluation of urinary frequency, dysuria, and right shoulder pain.  Urinalysis and exam are consistent with acute cystitis for which she will be treated with Pyridium and Bactrim.  She will be given Flexeril for shoulder pain and encouraged to continue taking the ibuprofen.  She was encouraged to return to the emergency department for symptoms of change or worsen if she is unable to schedule an  appointment with her primary care provider  INITIAL IMPRESSION / ASSESSMENT AND PLAN / ED COURSE  Pertinent labs & imaging results that were available during my care of the patient were reviewed by me and considered in my medical decision making (see chart for details).  ____________________________________________   FINAL CLINICAL IMPRESSION(S) / ED DIAGNOSES  Final diagnoses:  Acute cystitis with hematuria  Acute pain of right shoulder    Note:  This document was prepared using Dragon voice recognition software and may include unintentional dictation errors.    Chinita Pesterriplett, Jowanda Heeg B, FNP 08/31/17 1258    Governor RooksLord, Rebecca, MD 08/31/17 [phone]

## 2017-08-31 NOTE — ED Notes (Signed)
See triage note  Presents with urinary freq and dysuria for couple of days   Denies any n/v/ or fever

## 2017-08-31 NOTE — ED Triage Notes (Signed)
Pain with urination since yesterday

## 2017-11-04 ENCOUNTER — Other Ambulatory Visit: Payer: Self-pay

## 2017-11-04 ENCOUNTER — Emergency Department
Admission: EM | Admit: 2017-11-04 | Discharge: 2017-11-04 | Disposition: A | Discharge status: Home / Self Care | Payer: Medicaid Other | Attending: Emergency Medicine | Admitting: Emergency Medicine

## 2017-11-04 ENCOUNTER — Encounter: Payer: Self-pay | Admitting: *Deleted

## 2017-11-04 DIAGNOSIS — I10 Essential (primary) hypertension: Secondary | ICD-10-CM | POA: Insufficient documentation

## 2017-11-04 DIAGNOSIS — Z87891 Personal history of nicotine dependence: Secondary | ICD-10-CM | POA: Diagnosis not present

## 2017-11-04 DIAGNOSIS — Z79899 Other long term (current) drug therapy: Secondary | ICD-10-CM | POA: Diagnosis not present

## 2017-11-04 DIAGNOSIS — J029 Acute pharyngitis, unspecified: Secondary | ICD-10-CM | POA: Diagnosis present

## 2017-11-04 DIAGNOSIS — Z7984 Long term (current) use of oral hypoglycemic drugs: Secondary | ICD-10-CM | POA: Diagnosis not present

## 2017-11-04 DIAGNOSIS — J020 Streptococcal pharyngitis: Secondary | ICD-10-CM | POA: Diagnosis not present

## 2017-11-04 LAB — GROUP A STREP BY PCR: Group A Strep by PCR: DETECTED — AB

## 2017-11-04 MED ORDER — DIPHENHYDRAMINE HCL 12.5 MG/5ML PO ELIX
12.5000 mg | ORAL_SOLUTION | Freq: Once | ORAL | Status: AC
  Administered 2017-11-04: 12.5 mg via ORAL
  Filled 2017-11-04: qty 5

## 2017-11-04 MED ORDER — DIPHENHYDRAMINE HCL 12.5 MG/5ML PO SYRP: 12.5000 mg | ORAL_SOLUTION | Freq: Four times a day (QID) | ORAL | 0 refills | Status: DC | PRN

## 2017-11-04 MED ORDER — AMOXICILLIN 500 MG PO CAPS: 500.0000 mg | ORAL_CAPSULE | Freq: Three times a day (TID) | ORAL | 0 refills | Status: DC

## 2017-11-04 MED ORDER — LIDOCAINE VISCOUS 2 % MT SOLN: 5.0000 mL | Freq: Four times a day (QID) | OROMUCOSAL | 0 refills | Status: DC | PRN

## 2017-11-04 MED ORDER — LIDOCAINE VISCOUS 2 % MT SOLN
15.0000 mL | Freq: Once | OROMUCOSAL | Status: AC
  Administered 2017-11-04: 15 mL via OROMUCOSAL
  Filled 2017-11-04: qty 15

## 2017-11-04 NOTE — ED Notes (Addendum)
Pt result delayed due to pt being swabbed in triage for strep but tube not received in lab. Tube currently being sent to lab at this time.

## 2017-11-04 NOTE — ED Provider Notes (Signed)
[facility] Emergency Department Provider Note   ____________________________________________   First MD Initiated Contact with Patient 11/04/17 1238     (approximate)  I have reviewed the triage vital signs and the nursing notes.   HISTORY  Chief Complaint Sore Throat    HPI Tiffany Hickman is a 37 y.o. female patient complained of sore throat which began last night.  Patient that she noticed the pain worse with a.m. awakening.  Patient denies any URI signs or symptoms.  Patient state pain increased with swallowing but can tolerate food and fluids.  Patient rates the pain as a 9/10.  Patient described the pain as "sore".  No palliative measures for complaint.  Past Medical History:  Diagnosis Date  . Abscess, peritonsillar   . Back pain   . Cubital tunnel syndrome   . DOE (dyspnea on exertion)   . Hidradenitis suppurativa   . Hypertension   . Hypertension   . Patellar tendinitis   . Prediabetes   . Unspecified disorder of binocular vision     There are no active problems to display for this patient.   Past Surgical History:  Procedure Laterality Date  . SKIN GRAFT     LEFT FOOT    Prior to Admission medications   Medication Sig Start Date End Date Taking? Authorizing Provider  amoxicillin (AMOXIL) 500 MG capsule Take 1 capsule (500 mg total) by mouth 3 (three) times daily. 03/17/16   Tommi Rumps, PA-C  amoxicillin (AMOXIL) 500 MG capsule Take 1 capsule (500 mg total) by mouth 3 (three) times daily. 11/04/17   Joni Reining, PA-C  cyclobenzaprine (FLEXERIL) 10 MG tablet Take 1 tablet (10 mg total) by mouth 3 (three) times daily as needed for muscle spasms. 08/31/17   Triplett, Kasandra Knudsen, FNP  diphenhydrAMINE (BENYLIN) 12.5 MG/5ML syrup Take 5 mLs (12.5 mg total) by mouth 4 (four) times daily as needed for allergies. Mix with 5 mL of viscous lidocaine for swish and swallow. 11/04/17   Joni Reining, PA-C  hydrochlorothiazide  (HYDRODIURIL) 25 MG tablet Take 25 mg by mouth daily.    [provider]  lidocaine (XYLOCAINE) 2 % solution Use as directed 5 mLs in the mouth or throat every 6 (six) hours as needed for mouth pain. Mix with 5 mL of Benadryl elixir for swish and swallow. 11/04/17   Joni Reining, PA-C  metFORMIN (GLUCOPHAGE) 500 MG tablet Take 500 mg by mouth daily with breakfast.    [provider]  phenazopyridine (PYRIDIUM) 200 MG tablet Take 1 tablet (200 mg total) by mouth 3 (three) times daily as needed for pain. 08/31/17   Triplett, Rulon Eisenmenger B, FNP  pyridOXINE (VITAMIN B-6) 100 MG tablet Take 100 mg by mouth daily.    [provider]  sulfamethoxazole-trimethoprim (BACTRIM DS,SEPTRA DS) 800-160 MG tablet Take 1 tablet by mouth 2 (two) times daily. 08/31/17   Chinita Pester, FNP    Allergies Patient has no known allergies.  Family History  Problem Relation Age of Onset  . Diabetes Mother   . Hypertension Mother   . Hypertension Father   . COPD Father     Social History Social History   Tobacco Use  . Smoking status: Former Smoker    Years: 1.00  . Smokeless tobacco: Never Used  Substance Use Topics  . Alcohol use: No  . Drug use: No    Review of Systems Constitutional: No fever/chills Eyes: No visual changes. ENT: No  sore throat. Cardiovascular: Denies chest pain. Respiratory: Denies shortness of breath. Gastrointestinal: No abdominal pain.  No nausea, no vomiting.  No diarrhea.  No constipation. Genitourinary: Negative for dysuria. Musculoskeletal: Negative for back pain. Skin: Negative for rash. Neurological: Negative for headaches, focal weakness or numbness. Endocrine:Hypertension and prediabetic.   ____________________________________________   PHYSICAL EXAM:  VITAL SIGNS: ED Triage Vitals  Enc Vitals Group     BP 11/04/17 1200 (!) 154/91     Pulse Rate 11/04/17 1200 98     Resp 11/04/17 1200 18     Temp 11/04/17 1200 98.8 F (37.1 C)      Temp Source 11/04/17 1200 Oral     SpO2 11/04/17 1200 100 %     Weight 11/04/17 1159 263 lb (119.3 kg)     Height 11/04/17 1159  (1.676 m)     Head Circumference --      Peak Flow --      Pain Score 11/04/17 1204 9     Pain Loc --      Pain Edu? --      Excl. in GC? --    Constitutional: Alert and oriented. Well appearing and in no acute distress. Eyes: Conjunctivae are normal. PERRL. EOMI. Head: Atraumatic. Nose: No congestion/rhinnorhea. Mouth/Throat: Mucous membranes are moist.  Oropharynx erythematous. Neck: No stridor.  Hematological/Lymphatic/Immunilogical: No cervical lymphadenopathy. Cardiovascular: Normal rate, regular rhythm. Grossly normal heart sounds.  Good peripheral circulation. Respiratory: Normal respiratory effort.  No retractions. Lungs CTAB. Neurologic:  Normal speech and language. No gross focal neurologic deficits are appreciated. No gait instability. Skin:  Skin is warm, dry and intact. No rash noted. Psychiatric: Mood and affect are normal. Speech and behavior are normal.  ____________________________________________   LABS (all labs ordered are listed, but only abnormal results are displayed)  Labs Reviewed  GROUP A STREP BY PCR - Abnormal; Notable for the following components:      Result Value   Group A Strep by PCR DETECTED (*)    All other components within normal limits   ____________________________________________  EKG   ____________________________________________  RADIOLOGY  ED MD interpretation:    Official radiology report(s): No results found.  ____________________________________________   PROCEDURES  Procedure(s) performed: None  Procedures  Critical Care performed: No  ____________________________________________   INITIAL IMPRESSION / ASSESSMENT AND PLAN / ED COURSE  As part of my medical decision making, I reviewed the following data within the electronic MEDICAL RECORD NUMBER    Sore throat secondary to  strep pharyngitis.  Patient given discharge care instruction advised take medication as directed.  Patient advised to follow-up PCP for continued care.      ____________________________________________   FINAL CLINICAL IMPRESSION(S) / ED DIAGNOSES  Final diagnoses:  Strep pharyngitis     ED Discharge Orders        Ordered    amoxicillin (AMOXIL) 500 MG capsule  3 times daily     11/04/17 1414    lidocaine (XYLOCAINE) 2 % solution  Every 6 hours PRN     11/04/17 1414    diphenhydrAMINE (BENYLIN) 12.5 MG/5ML syrup  4 times daily PRN     11/04/17 1414       Note:  This document was prepared using Dragon voice recognition software and may include unintentional dictation errors.    Joni Reining, PA-C 11/04/17 1417    Jeanmarie Plant, MD 11/04/17 [phone]

## 2017-11-04 NOTE — ED Triage Notes (Signed)
Pt to Ed reporting sore throat that is painful when swallowing for the past two days. No fevers. No white patches noted. No swelling or SOB reported.

## 2018-01-05 ENCOUNTER — Emergency Department
Admission: EM | Admit: 2018-01-05 | Discharge: 2018-01-05 | Disposition: A | Discharge status: Home / Self Care | Payer: Medicaid Other | Attending: Emergency Medicine | Admitting: Emergency Medicine

## 2018-01-05 ENCOUNTER — Encounter: Payer: Self-pay | Admitting: Emergency Medicine

## 2018-01-05 DIAGNOSIS — I10 Essential (primary) hypertension: Secondary | ICD-10-CM | POA: Insufficient documentation

## 2018-01-05 DIAGNOSIS — Z87891 Personal history of nicotine dependence: Secondary | ICD-10-CM | POA: Insufficient documentation

## 2018-01-05 DIAGNOSIS — Z733 Stress, not elsewhere classified: Secondary | ICD-10-CM | POA: Insufficient documentation

## 2018-01-05 DIAGNOSIS — R0789 Other chest pain: Secondary | ICD-10-CM | POA: Diagnosis present

## 2018-01-05 DIAGNOSIS — F410 Panic disorder [episodic paroxysmal anxiety] without agoraphobia: Secondary | ICD-10-CM | POA: Diagnosis not present

## 2018-01-05 MED ORDER — LORAZEPAM 1 MG PO TABS: 1.0000 mg | ORAL_TABLET | Freq: Three times a day (TID) | ORAL | 0 refills | Status: AC | PRN

## 2018-01-05 MED ORDER — DIAZEPAM 5 MG PO TABS
10.0000 mg | ORAL_TABLET | Freq: Once | ORAL | Status: AC
Start: 2018-01-05 — End: 2018-01-05
  Administered 2018-01-05: 10 mg via ORAL
  Filled 2018-01-05: qty 2

## 2018-01-05 NOTE — ED Notes (Signed)
Patient has ride here-her dad- she say she is comfortable going home.+

## 2018-01-05 NOTE — ED Triage Notes (Signed)
Says she had panic attack today.  Has not had in 2 years.  She is calm now.  Denies any si or hi.

## 2018-01-05 NOTE — ED Provider Notes (Signed)
[facility] Emergency Department Provider Note       Time seen: ----------------------------------------- 12:59 PM on 01/05/2018 -----------------------------------------   I have reviewed the triage vital signs and the nursing notes.  HISTORY   Chief Complaint No chief complaint on file.    HPI Tiffany Hickman is a 37 y.o. female with a history of hypertension and prediabetes who presents to the ED for anxiety.  Patient states she was sitting watching her children eat lunch and began having chest tightness and feeling anxious and tearful.  Patient states is been 2 years since her last panic attack but this felt like similar.  Patient states she has been under a lot of stress, is also thinking a lot about her mother who died many years ago.  She denies any recent illness or other complaints.  She currently does not take any medication for anxiety.  Past Medical History:  Diagnosis Date  . Abscess, peritonsillar   . Back pain   . Cubital tunnel syndrome   . DOE (dyspnea on exertion)   . Hidradenitis suppurativa   . Hypertension   . Hypertension   . Patellar tendinitis   . Prediabetes   . Unspecified disorder of binocular vision     There are no active problems to display for this patient.   Past Surgical History:  Procedure Laterality Date  . SKIN GRAFT     LEFT FOOT    Allergies Patient has no known allergies.  Social History Social History   Tobacco Use  . Smoking status: Former Smoker    Years: 1.00  . Smokeless tobacco: Never Used  Substance Use Topics  . Alcohol use: No  . Drug use: No   Review of Systems Constitutional: Negative for fever. Cardiovascular: Negative for chest pain. Respiratory: Negative for shortness of breath. Gastrointestinal: Negative for abdominal pain, vomiting and diarrhea. Musculoskeletal: Negative for back pain. Skin: Negative for rash. Neurological: Negative for headaches, focal weakness or  numbness. Psychiatric: Positive for anxiety  All systems negative/normal/unremarkable except as stated in the HPI  ____________________________________________   PHYSICAL EXAM:  VITAL SIGNS: ED Triage Vitals  Enc Vitals Group     BP      Pulse      Resp      Temp      Temp src      SpO2      Weight      Height      Head Circumference      Peak Flow      Pain Score      Pain Loc      Pain Edu?      Excl. in GC?    Constitutional: Alert and oriented.  Anxious and tearful, no distress Eyes: Conjunctivae are normal. Normal extraocular movements. ENT   Head: Normocephalic and atraumatic.   Nose: No congestion/rhinnorhea.   Mouth/Throat: Mucous membranes are moist.   Neck: No stridor. Cardiovascular: Normal rate, regular rhythm. No murmurs, rubs, or gallops. Respiratory: Normal respiratory effort without tachypnea nor retractions. Breath sounds are clear and equal bilaterally. No wheezes/rales/rhonchi. Gastrointestinal: Soft and nontender. Normal bowel sounds Musculoskeletal: Nontender with normal range of motion in extremities. No lower extremity tenderness nor edema. Neurologic:  Normal speech and language. No gross focal neurologic deficits are appreciated.  Skin:  Skin is warm, dry and intact. No rash noted. Psychiatric: tearful and anxious ____________________________________________  ED COURSE:  As part of my medical decision making, I reviewed the following data  within the electronic MEDICAL RECORD NUMBER History obtained from family if available, nursing notes, old chart and ekg, as well as notes from prior ED visits. Patient presented for anxiety and likely panic attack, patient states she currently feels better.   Procedures ____________________________________________  DIFFERENTIAL DIAGNOSIS   Anxiety, depression  FINAL ASSESSMENT AND PLAN  Panic attack   Plan: The patient had presented for anxiety and likely panic attack at home.  Symptoms  have mostly resolved, we did give a dose of Valium here for anxiety.  I will prescribe a short supply of same and she is encouraged to have outpatient follow-up for likely psychotherapy   Ulice DashJohnathan E Abdulkarim Eberlin, MD   Note: This note was generated in part or whole with voice recognition software. Voice recognition is usually quite accurate but there are transcription errors that can and very often do occur. I apologize for any typographical errors that were not detected and corrected.     Emily FilbertWilliams, Aalyssa Elderkin E, MD 01/05/18 1315

## 2018-03-19 ENCOUNTER — Encounter: Payer: Self-pay | Admitting: Emergency Medicine

## 2018-03-19 ENCOUNTER — Emergency Department
Admission: EM | Admit: 2018-03-19 | Discharge: 2018-03-20 | Disposition: A | Discharge status: Home / Self Care | Payer: Medicaid Other | Attending: Emergency Medicine | Admitting: Emergency Medicine

## 2018-03-19 DIAGNOSIS — Y906 Blood alcohol level of 120-199 mg/100 ml: Secondary | ICD-10-CM | POA: Insufficient documentation

## 2018-03-19 DIAGNOSIS — F10929 Alcohol use, unspecified with intoxication, unspecified: Secondary | ICD-10-CM | POA: Insufficient documentation

## 2018-03-19 DIAGNOSIS — I10 Essential (primary) hypertension: Secondary | ICD-10-CM | POA: Insufficient documentation

## 2018-03-19 DIAGNOSIS — Z79899 Other long term (current) drug therapy: Secondary | ICD-10-CM | POA: Insufficient documentation

## 2018-03-19 DIAGNOSIS — Z8673 Personal history of transient ischemic attack (TIA), and cerebral infarction without residual deficits: Secondary | ICD-10-CM | POA: Insufficient documentation

## 2018-03-19 DIAGNOSIS — Z87891 Personal history of nicotine dependence: Secondary | ICD-10-CM | POA: Insufficient documentation

## 2018-03-19 DIAGNOSIS — Z3202 Encounter for pregnancy test, result negative: Secondary | ICD-10-CM | POA: Insufficient documentation

## 2018-03-19 DIAGNOSIS — F410 Panic disorder [episodic paroxysmal anxiety] without agoraphobia: Secondary | ICD-10-CM | POA: Insufficient documentation

## 2018-03-19 DIAGNOSIS — Z7984 Long term (current) use of oral hypoglycemic drugs: Secondary | ICD-10-CM | POA: Insufficient documentation

## 2018-03-19 HISTORY — DX: Cerebral infarction, unspecified: I63.9
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
## 2018-03-19 NOTE — ED Triage Notes (Signed)
Pt. Here via EMS for Alcohol intoxication.

## 2018-03-20 LAB — URINALYSIS, COMPLETE (UACMP) WITH MICROSCOPIC
Bacteria, UA: NONE SEEN
Bilirubin Urine: NEGATIVE
GLUCOSE, UA: NEGATIVE mg/dL
Hgb urine dipstick: NEGATIVE
Ketones, ur: NEGATIVE mg/dL
Leukocytes, UA: NEGATIVE
Nitrite: NEGATIVE
PROTEIN: NEGATIVE mg/dL
SPECIFIC GRAVITY, URINE: 1.004 — AB (ref 1.005–1.030)
pH: 6 (ref 5.0–8.0)

## 2018-03-20 LAB — BLOOD GAS, VENOUS
ACID-BASE DEFICIT: 0.2 mmol/L (ref 0.0–2.0)
BICARBONATE: 24 mmol/L (ref 20.0–28.0)
O2 Saturation: 99.8 %
PCO2 VEN: 37 mmHg — AB (ref 44.0–60.0)
PH VEN: 7.42 (ref 7.250–7.430)
Patient temperature: 37
pO2, Ven: 229 mmHg — ABNORMAL HIGH (ref 32.0–45.0)

## 2018-03-20 LAB — CBC WITH DIFFERENTIAL/PLATELET
BASOS PCT: 1 %
Basophils Absolute: 0.1 10*3/uL (ref 0–0.1)
EOS PCT: 5 %
Eosinophils Absolute: 0.4 10*3/uL (ref 0–0.7)
HEMATOCRIT: 37.4 % (ref 35.0–47.0)
Hemoglobin: 13.3 g/dL (ref 12.0–16.0)
Lymphocytes Relative: 27 %
Lymphs Abs: 2.4 10*3/uL (ref 1.0–3.6)
MCH: 29.7 pg (ref 26.0–34.0)
MCHC: 35.6 g/dL (ref 32.0–36.0)
MCV: 83.4 fL (ref 80.0–100.0)
Monocytes Absolute: 0.6 10*3/uL (ref 0.2–0.9)
Monocytes Relative: 7 %
NEUTROS ABS: 5.3 10*3/uL (ref 1.4–6.5)
Neutrophils Relative %: 60 %
PLATELETS: 348 10*3/uL (ref 150–440)
RBC: 4.48 MIL/uL (ref 3.80–5.20)
RDW: 13.7 % (ref 11.5–14.5)
WBC: 8.8 10*3/uL (ref 3.6–11.0)

## 2018-03-20 LAB — COMPREHENSIVE METABOLIC PANEL
ALK PHOS: 79 U/L (ref 38–126)
ALT: 13 U/L (ref 0–44)
AST: 16 U/L (ref 15–41)
Albumin: 3.9 g/dL (ref 3.5–5.0)
Anion gap: 5 (ref 5–15)
BILIRUBIN TOTAL: 0.2 mg/dL — AB (ref 0.3–1.2)
BUN: 9 mg/dL (ref 6–20)
CHLORIDE: 109 mmol/L (ref 98–111)
CO2: 25 mmol/L (ref 22–32)
CREATININE: 0.75 mg/dL (ref 0.44–1.00)
Calcium: 9 mg/dL (ref 8.9–10.3)
Glucose, Bld: 104 mg/dL — ABNORMAL HIGH (ref 70–99)
Potassium: 3.2 mmol/L — ABNORMAL LOW (ref 3.5–5.1)
Sodium: 139 mmol/L (ref 135–145)
TOTAL PROTEIN: 8 g/dL (ref 6.5–8.1)

## 2018-03-20 LAB — LIPASE, BLOOD: LIPASE: 32 U/L (ref 11–51)

## 2018-03-20 LAB — PREGNANCY, URINE: PREG TEST UR: NEGATIVE

## 2018-03-20 LAB — SALICYLATE LEVEL: Salicylate Lvl: <7 mg/dL (ref 2.8–30.0)

## 2018-03-20 LAB — URINE DRUG SCREEN, QUALITATIVE (ARMC ONLY)
AMPHETAMINES, UR SCREEN: NOT DETECTED
Barbiturates, Ur Screen: NOT DETECTED
Benzodiazepine, Ur Scrn: NOT DETECTED
COCAINE METABOLITE, UR ~~LOC~~: NOT DETECTED
Cannabinoid 50 Ng, Ur ~~LOC~~: NOT DETECTED
MDMA (ECSTASY) UR SCREEN: NOT DETECTED
Methadone Scn, Ur: NOT DETECTED
Opiate, Ur Screen: NOT DETECTED
Phencyclidine (PCP) Ur S: NOT DETECTED
TRICYCLIC, UR SCREEN: NOT DETECTED

## 2018-03-20 LAB — ACETAMINOPHEN LEVEL: Acetaminophen (Tylenol), Serum: <10 ug/mL — ABNORMAL LOW (ref 10–30)

## 2018-03-20 LAB — ETHANOL: ALCOHOL ETHYL (B): 130 mg/dL — AB (ref <10)

## 2018-03-20 NOTE — Discharge Instructions (Addendum)
Please return for any further problems °

## 2018-03-20 NOTE — ED Notes (Signed)
Pt. Requested phone to call and check on her children at home.  Pt. Given phone to call.
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
## 2018-03-31 ENCOUNTER — Emergency Department: Payer: Self-pay

## 2018-03-31 ENCOUNTER — Encounter: Payer: Self-pay | Admitting: Emergency Medicine

## 2018-03-31 ENCOUNTER — Emergency Department
Admission: EM | Admit: 2018-03-31 | Discharge: 2018-03-31 | Disposition: A | Discharge status: Home / Self Care | Payer: Self-pay | Attending: Emergency Medicine | Admitting: Emergency Medicine

## 2018-03-31 DIAGNOSIS — S63635A Sprain of interphalangeal joint of left ring finger, initial encounter: Secondary | ICD-10-CM | POA: Insufficient documentation

## 2018-03-31 DIAGNOSIS — Y9289 Other specified places as the place of occurrence of the external cause: Secondary | ICD-10-CM | POA: Insufficient documentation

## 2018-03-31 DIAGNOSIS — I10 Essential (primary) hypertension: Secondary | ICD-10-CM | POA: Insufficient documentation

## 2018-03-31 DIAGNOSIS — M79645 Pain in left finger(s): Secondary | ICD-10-CM | POA: Insufficient documentation

## 2018-03-31 DIAGNOSIS — Y998 Other external cause status: Secondary | ICD-10-CM | POA: Insufficient documentation

## 2018-03-31 DIAGNOSIS — Z8673 Personal history of transient ischemic attack (TIA), and cerebral infarction without residual deficits: Secondary | ICD-10-CM | POA: Insufficient documentation

## 2018-03-31 DIAGNOSIS — X58XXXA Exposure to other specified factors, initial encounter: Secondary | ICD-10-CM | POA: Insufficient documentation

## 2018-03-31 DIAGNOSIS — Y9389 Activity, other specified: Secondary | ICD-10-CM | POA: Insufficient documentation

## 2018-03-31 LAB — CBC WITH DIFFERENTIAL/PLATELET
Basophils Absolute: 0.1 10*3/uL (ref 0–0.1)
Basophils Relative: 1 %
EOS ABS: 0.2 10*3/uL (ref 0–0.7)
EOS PCT: 2 %
HEMATOCRIT: 39.8 % (ref 35.0–47.0)
Hemoglobin: 13.4 g/dL (ref 12.0–16.0)
LYMPHS ABS: 1.8 10*3/uL (ref 1.0–3.6)
LYMPHS PCT: 16 %
MCH: 28.2 pg (ref 26.0–34.0)
MCHC: 33.8 g/dL (ref 32.0–36.0)
MCV: 83.4 fL (ref 80.0–100.0)
MONOS PCT: 5 %
Monocytes Absolute: 0.6 10*3/uL (ref 0.2–0.9)
NEUTROS PCT: 76 %
Neutro Abs: 9 10*3/uL — ABNORMAL HIGH (ref 1.4–6.5)
Platelets: 334 10*3/uL (ref 150–440)
RBC: 4.77 MIL/uL (ref 3.80–5.20)
RDW: 14.1 % (ref 11.5–14.5)
WBC: 11.8 10*3/uL — ABNORMAL HIGH (ref 3.6–11.0)

## 2018-03-31 LAB — BASIC METABOLIC PANEL
Anion gap: 9 (ref 5–15)
BUN: 12 mg/dL (ref 6–20)
CHLORIDE: 106 mmol/L (ref 98–111)
CO2: 25 mmol/L (ref 22–32)
CREATININE: 0.82 mg/dL (ref 0.44–1.00)
Calcium: 9.2 mg/dL (ref 8.9–10.3)
GFR calc Af Amer: >60 mL/min (ref >60)
GFR calc non Af Amer: >60 mL/min (ref >60)
Glucose, Bld: 92 mg/dL (ref 70–99)
POTASSIUM: 3.1 mmol/L — AB (ref 3.5–5.1)
Sodium: 140 mmol/L (ref 135–145)

## 2018-03-31 LAB — URIC ACID: Uric Acid, Serum: 6.6 mg/dL (ref 2.5–7.1)

## 2018-03-31 LAB — SEDIMENTATION RATE: Sed Rate: 11 mm/hr (ref 0–20)

## 2018-03-31 MED ORDER — CYCLOBENZAPRINE HCL 10 MG PO TABS
10.0000 mg | ORAL_TABLET | Freq: Once | ORAL | Status: AC
  Administered 2018-03-31: 10 mg via ORAL
  Filled 2018-03-31: qty 1

## 2018-03-31 MED ORDER — NAPROXEN 500 MG PO TABS: 500.0000 mg | ORAL_TABLET | Freq: Two times a day (BID) | ORAL | 0 refills | Status: AC

## 2018-03-31 MED ORDER — CYCLOBENZAPRINE HCL 5 MG PO TABS: 5.0000 mg | ORAL_TABLET | Freq: Three times a day (TID) | ORAL | 0 refills | Status: DC | PRN

## 2018-03-31 MED ORDER — NAPROXEN 500 MG PO TABS
500.0000 mg | ORAL_TABLET | Freq: Once | ORAL | Status: AC
  Administered 2018-03-31: 500 mg via ORAL
  Filled 2018-03-31: qty 1

## 2018-03-31 NOTE — Discharge Instructions (Signed)
Your exam, x-ray, and labs do not show any signs of arthritis, fracture, or dislocation. You are being treated for a finger sprain and tendinitis. Wear the fingers buddy-taped or splinted for comfort. Take the prescription meds as directed. Apply ice and/or moist heat. Follow-up with the hand specialist as needed. Return to the Emergency Department for worsening swelling, pain, or dysfunction.

## 2018-03-31 NOTE — ED Notes (Signed)
Pt is refusing labs to be drawn  provider made aware

## 2018-03-31 NOTE — ED Provider Notes (Signed)
[facility] Emergency Department Provider Note ____________________________________________  Time seen: 1519  I have reviewed the triage vital signs and the nursing notes.  HISTORY  Chief Complaint  Hand Pain  HPI Tiffany Hickman is a 37 y.o.right-handed female awoke with left ring finger pain to the palmar knuckle and distal finger. She describes swelling with sharp and throbbing pain between the MCP and the DIP. She denies any trauma to the finger. She reports pain with ROM and intermittent paresthesias. She denies any recent, remote, or chronic arthralgias. She presents with pain and swelling to the left finger. She has not taken any medicine for this complaint. She has a history of HTN, HS, and previous stroke. She does not work outside of the home.   Past Medical History:  Diagnosis Date  . Abscess, peritonsillar   . Back pain   . Cubital tunnel syndrome   . DOE (dyspnea on exertion)   . Hidradenitis suppurativa   . Hypertension   . Hypertension   . Patellar tendinitis   . Prediabetes   . Stroke (HCC)   . Unspecified disorder of binocular vision     There are no active problems to display for this patient.   Past Surgical History:  Procedure Laterality Date  . SKIN GRAFT     LEFT FOOT    Prior to Admission medications   Medication Sig Start Date End Date Taking? Authorizing Provider  cyclobenzaprine (FLEXERIL) 5 MG tablet Take 1 tablet (5 mg total) by mouth 3 (three) times daily as needed for muscle spasms. 03/31/18   Kataleia Quaranta, Charlesetta Ivory, PA-C  hydrochlorothiazide (HYDRODIURIL) 25 MG tablet Take 25 mg by mouth daily.    [provider]  LORazepam (ATIVAN) 1 MG tablet Take 1 tablet (1 mg total) by mouth every 8 (eight) hours as needed for anxiety. 01/05/18 01/05/19  Emily Filbert, MD  metFORMIN (GLUCOPHAGE) 500 MG tablet Take 500 mg by mouth daily with breakfast.    [provider]  naproxen (NAPROSYN) 500 MG tablet  Take 1 tablet (500 mg total) by mouth 2 (two) times daily with a meal. 03/31/18 04/30/18  Jaylynn Siefert, Charlesetta Ivory, PA-C  pyridOXINE (VITAMIN B-6) 100 MG tablet Take 100 mg by mouth daily.    [provider]    Allergies Patient has no known allergies.  Family History  Problem Relation Age of Onset  . Diabetes Mother   . Hypertension Mother   . Hypertension Father   . COPD Father     Social History Social History   Tobacco Use  . Smoking status: Former Smoker    Years: 1.00  . Smokeless tobacco: Never Used  Substance Use Topics  . Alcohol use: Yes  . Drug use: No    Review of Systems  Constitutional: Negative for fever. Cardiovascular: Negative for chest pain. Respiratory: Negative for shortness of breath. Gastrointestinal: Negative for abdominal pain, vomiting and diarrhea. Musculoskeletal: Negative for back pain. Left ring finger pain as above Skin: Negative for rash. Neurological: Negative for headaches, focal weakness or numbness. ____________________________________________  PHYSICAL EXAM:  VITAL SIGNS: ED Triage Vitals  Enc Vitals Group     BP 03/31/18 1457 (!) 174/98     Pulse Rate 03/31/18 1457 90     Resp 03/31/18 1457 20     Temp 03/31/18 1457 98.9 F (37.2 C)     Temp Source 03/31/18 1457 Oral     SpO2 03/31/18 1457 100 %     Weight  03/31/18 1440 260 lb (117.9 kg)     Height 03/31/18 1440 5\' 6"  (1.676 m)     Head Circumference --      Peak Flow --      Pain Score 03/31/18 1440 6     Pain Loc --      Pain Edu? --      Excl. in GC? --    Constitutional: Alert and oriented. Well appearing and in no distress. Head: Normocephalic and atraumatic. Cardiovascular: Normal rate, regular rhythm. Normal distal pulses. Respiratory: Normal respiratory effort. No wheezes/rales/rhonchi. Gastrointestinal: Soft and nontender. No distention. Musculoskeletal: Left hand without obvious deformity or dislocation.  Patient localizes pain to the ring finger of  the left hand.  There is some subtle erythema and maybe edema to the middle phalanx of the left hand.  Patient describes pain is increased with attempts to extend or flex the ring finger(s) fully.  Nontender with normal range of motion in all extremities.  Neurologic:  Normal gross sensation. Normal speech and language. No gross focal neurologic deficits are appreciated. Skin:  Skin is warm, dry and intact. No rash noted. ____________________________________________   LABS (pertinent positives/negatives) Labs Reviewed  BASIC METABOLIC PANEL - Abnormal; Notable for the following components:      Result Value   Potassium 3.1 (*)    All other components within normal limits  CBC WITH DIFFERENTIAL/PLATELET - Abnormal; Notable for the following components:   WBC 11.8 (*)    Neutro Abs 9.0 (*)    All other components within normal limits  SEDIMENTATION RATE  URIC ACID  ____________________________________________   RADIOLOGY  Right Hand  Negative ____________________________________________  PROCEDURES  Procedures  Naproxen 500 mg PO Flexeril 10 mg PO Fingers Buddy Taped ____________________________________________  INITIAL IMPRESSION / ASSESSMENT AND PLAN / ED COURSE  Patient with ED evaluation of left ring finger pain. She noted onset upon awakening this morning, without preceding injury or trauma. Her labs, x-ray, and exam are reassuring, clinically shows a finger sprain or tendinitis. She is splinted and given Naproxen and Flexeril. She will monitor closely for changes and return immediately. Otherwise, she will follow-up with ortho-hand, as needed. ____________________________________________  FINAL CLINICAL IMPRESSION(S) / ED DIAGNOSES  Final diagnoses:  Finger pain, left  Sprain of interphalangeal joint of left ring finger, initial encounter     Lissa Hoard, PA-C 03/31/18 2350    Minna Antis, MD 04/03/18 [phone]

## 2018-03-31 NOTE — ED Triage Notes (Signed)
Pt reports she awoke this am and her left hand ring finger was hurting. Pt denies obvious injuries, slight swelling noted. Denies all other sx's.

## 2018-03-31 NOTE — ED Notes (Signed)
See triage note  Presents with left hand pain   States pain is mainly to left ring finger  Min swelling noted   Denies any injury

## 2018-07-10 ENCOUNTER — Other Ambulatory Visit: Payer: Self-pay

## 2018-07-10 ENCOUNTER — Emergency Department
Admission: EM | Admit: 2018-07-10 | Discharge: 2018-07-10 | Disposition: A | Discharge status: Home / Self Care | Payer: Self-pay | Attending: Emergency Medicine | Admitting: Emergency Medicine

## 2018-07-10 ENCOUNTER — Encounter: Payer: Self-pay | Admitting: Emergency Medicine

## 2018-07-10 DIAGNOSIS — Z87891 Personal history of nicotine dependence: Secondary | ICD-10-CM | POA: Insufficient documentation

## 2018-07-10 DIAGNOSIS — I10 Essential (primary) hypertension: Secondary | ICD-10-CM | POA: Insufficient documentation

## 2018-07-10 DIAGNOSIS — Z7984 Long term (current) use of oral hypoglycemic drugs: Secondary | ICD-10-CM | POA: Insufficient documentation

## 2018-07-10 DIAGNOSIS — J101 Influenza due to other identified influenza virus with other respiratory manifestations: Secondary | ICD-10-CM

## 2018-07-10 DIAGNOSIS — Z79899 Other long term (current) drug therapy: Secondary | ICD-10-CM | POA: Insufficient documentation

## 2018-07-10 DIAGNOSIS — J09X2 Influenza due to identified novel influenza A virus with other respiratory manifestations: Secondary | ICD-10-CM | POA: Insufficient documentation

## 2018-07-10 LAB — INFLUENZA PANEL BY PCR (TYPE A & B)
Influenza A By PCR: POSITIVE — AB
Influenza B By PCR: NEGATIVE

## 2018-07-10 MED ORDER — OSELTAMIVIR PHOSPHATE 75 MG PO CAPS: 75.0000 mg | ORAL_CAPSULE | Freq: Two times a day (BID) | ORAL | 0 refills | Status: DC

## 2018-07-10 MED ORDER — ACETAMINOPHEN 325 MG PO TABS
650.0000 mg | ORAL_TABLET | Freq: Once | ORAL | Status: AC
  Administered 2018-07-10: 650 mg via ORAL
  Filled 2018-07-10: qty 2

## 2018-07-10 NOTE — ED Notes (Signed)
Pt c/o cough and bodyaches x 1 day. Medicated in triage for fever 102

## 2018-07-10 NOTE — ED Provider Notes (Signed)
[facility] Emergency Department Provider Note  ____________________________________________   First MD Initiated Contact with Patient 07/10/18 1321     (approximate)  I have reviewed the triage vital signs and the nursing notes.   HISTORY  Chief Complaint Cough    HPI ILAH LIEBELT is a 38 y.o. female flulike symptoms, patient is complained of fever, chills, body aches.  cough, sore throat, denies vomiting, denies diarrhea; denies chest pain or sob.  Sx for 1 days   Past Medical History:  Diagnosis Date  . Abscess, peritonsillar   . Back pain   . Cubital tunnel syndrome   . DOE (dyspnea on exertion)   . Hidradenitis suppurativa   . Hypertension   . Hypertension   . Patellar tendinitis   . Prediabetes   . Stroke (HCC)   . Unspecified disorder of binocular vision     There are no active problems to display for this patient.   Past Surgical History:  Procedure Laterality Date  . SKIN GRAFT     LEFT FOOT    Prior to Admission medications   Medication Sig Start Date End Date Taking? Authorizing Provider  cyclobenzaprine (FLEXERIL) 5 MG tablet Take 1 tablet (5 mg total) by mouth 3 (three) times daily as needed for muscle spasms. 03/31/18   Menshew, Charlesetta Ivory, PA-C  hydrochlorothiazide (HYDRODIURIL) 25 MG tablet Take 25 mg by mouth daily.    [provider]  LORazepam (ATIVAN) 1 MG tablet Take 1 tablet (1 mg total) by mouth every 8 (eight) hours as needed for anxiety. 01/05/18 01/05/19  Emily Filbert, MD  metFORMIN (GLUCOPHAGE) 500 MG tablet Take 500 mg by mouth daily with breakfast.    [provider]  oseltamivir (TAMIFLU) 75 MG capsule Take 1 capsule (75 mg total) by mouth 2 (two) times daily. 07/10/18   Danija Gosa, Roselyn Bering, PA-C  pyridOXINE (VITAMIN B-6) 100 MG tablet Take 100 mg by mouth daily.    [provider]    Allergies Patient has no known allergies.  Family History  Problem Relation Age of  Onset  . Diabetes Mother   . Hypertension Mother   . Hypertension Father   . COPD Father     Social History Social History   Tobacco Use  . Smoking status: Former Smoker    Years: 1.00  . Smokeless tobacco: Never Used  Substance Use Topics  . Alcohol use: Yes  . Drug use: No    Review of Systems  Constitutional: Positive fever/chills Eyes: No visual changes. ENT: Positive sore throat. Respiratory: Positive cough Genitourinary: Negative for dysuria. Musculoskeletal: Negative for back pain. Skin: Negative for rash.    ____________________________________________   PHYSICAL EXAM:  VITAL SIGNS: ED Triage Vitals  Enc Vitals Group     BP 07/10/18 1256 (!) 184/100     Pulse Rate 07/10/18 1256 (!) 119     Resp 07/10/18 1256 18     Temp 07/10/18 1256 (!) 102.2 F (39 C)     Temp Source 07/10/18 1256 Oral     SpO2 07/10/18 1256 100 %     Weight 07/10/18 1257 263 lb (119.3 kg)     Height 07/10/18 1257 5\' 6"  (1.676 m)     Head Circumference --      Peak Flow --      Pain Score 07/10/18 1257 8     Pain Loc --      Pain Edu? --  Excl. in GC? --     Constitutional: Alert and oriented. Well appearing and in no acute distress.  Patient is febrile Eyes: Conjunctivae are normal.  Head: Atraumatic. Nose: No congestion/rhinnorhea. Mouth/Throat: Mucous membranes are moist.  Throat is mildly irritated Neck:  supple no lymphadenopathy noted Cardiovascular: Tachycardic regular rhythm. Heart sounds are normal, blood pressure is elevated Respiratory: Normal respiratory effort.  No retractions, lungs c t a  Abd: soft nontender bs normal all 4 quad GU: deferred Musculoskeletal: FROM all extremities, warm and well perfused Neurologic:  Normal speech and language.  Skin:  Skin is warm, dry and intact. No rash noted. Psychiatric: Mood and affect are normal. Speech and behavior are normal.  ____________________________________________   LABS (all labs ordered are listed,  but only abnormal results are displayed)  Labs Reviewed  INFLUENZA PANEL BY PCR (TYPE A & B) - Abnormal; Notable for the following components:      Result Value   Influenza A By PCR POSITIVE (*)    All other components within normal limits   ____________________________________________   ____________________________________________  RADIOLOGY    ____________________________________________   PROCEDURES  Procedure(s) performed: No  Procedures    ____________________________________________   INITIAL IMPRESSION / ASSESSMENT AND PLAN / ED COURSE  Pertinent labs & imaging results that were available during my care of the patient were reviewed by me and considered in my medical decision making (see chart for details).   Patient is a 38 year old female presents emergency department flulike symptoms.  Physical exam shows a febrile patient, tachycardia.  Blood pressure is also elevated.  Patient has dry cough.  Influenza swab is positive for influenza A.   Explained the findings to the patient.  She is given prescription for Tamiflu.  She was instructed to use over-the-counter Tylenol/ibuprofen.  She is to drink plenty of fluids.  She states she understands and was discharged in stable condition.  As part of my medical decision making, I reviewed the following data within the electronic MEDICAL RECORD NUMBER Nursing notes reviewed and incorporated, Labs reviewed influenza is positive for a, Old chart reviewed, Notes from prior ED visits and [employer] Controlled Substance Database  ____________________________________________   FINAL CLINICAL IMPRESSION(S) / ED DIAGNOSES  Final diagnoses:  Influenza A      NEW MEDICATIONS STARTED DURING THIS VISIT:  New Prescriptions   OSELTAMIVIR (TAMIFLU) 75 MG CAPSULE    Take 1 capsule (75 mg total) by mouth 2 (two) times daily.     Note:  This document was prepared using Dragon voice recognition software and may include unintentional  dictation errors.    Faythe Ghee, PA-C 07/10/18 1525    Minna Antis, MD 07/11/18 [phone]

## 2018-07-10 NOTE — ED Triage Notes (Signed)
Cough and body aches since yesterday.

## 2018-07-10 NOTE — Discharge Instructions (Addendum)
Take Tylenol and ibuprofen for fever as needed.  Tamiflu if you would like to take this medication to decrease her symptoms.  Otherwise she can use over-the-counter TheraFlu.  Drink plenty fluids.  Return emergency department worsening.

## 2018-09-15 ENCOUNTER — Other Ambulatory Visit: Payer: Self-pay

## 2018-09-15 ENCOUNTER — Emergency Department
Admission: EM | Admit: 2018-09-15 | Discharge: 2018-09-15 | Disposition: A | Discharge status: Home / Self Care | Payer: Self-pay | Attending: Emergency Medicine | Admitting: Emergency Medicine

## 2018-09-15 ENCOUNTER — Emergency Department: Payer: Self-pay

## 2018-09-15 ENCOUNTER — Encounter: Payer: Self-pay | Admitting: *Deleted

## 2018-09-15 DIAGNOSIS — Z79899 Other long term (current) drug therapy: Secondary | ICD-10-CM | POA: Insufficient documentation

## 2018-09-15 DIAGNOSIS — I10 Essential (primary) hypertension: Secondary | ICD-10-CM | POA: Insufficient documentation

## 2018-09-15 DIAGNOSIS — Z87891 Personal history of nicotine dependence: Secondary | ICD-10-CM | POA: Insufficient documentation

## 2018-09-15 DIAGNOSIS — J9801 Acute bronchospasm: Secondary | ICD-10-CM | POA: Insufficient documentation

## 2018-09-15 DIAGNOSIS — Z7984 Long term (current) use of oral hypoglycemic drugs: Secondary | ICD-10-CM | POA: Insufficient documentation

## 2018-09-15 MED ORDER — BENZONATATE 100 MG PO CAPS
200.0000 mg | ORAL_CAPSULE | Freq: Once | ORAL | Status: AC
  Administered 2018-09-15: 200 mg via ORAL
  Filled 2018-09-15: qty 2

## 2018-09-15 MED ORDER — ALBUTEROL SULFATE HFA 108 (90 BASE) MCG/ACT IN AERS: 2.0000 | INHALATION_SPRAY | Freq: Four times a day (QID) | RESPIRATORY_TRACT | 2 refills | Status: DC | PRN

## 2018-09-15 MED ORDER — METHYLPREDNISOLONE 4 MG PO TBPK: ORAL_TABLET | ORAL | 0 refills | Status: DC

## 2018-09-15 MED ORDER — DEXAMETHASONE SODIUM PHOSPHATE 10 MG/ML IJ SOLN
10.0000 mg | Freq: Once | INTRAMUSCULAR | Status: AC
  Administered 2018-09-15: 10 mg via INTRAMUSCULAR
  Filled 2018-09-15: qty 1

## 2018-09-15 MED ORDER — IPRATROPIUM-ALBUTEROL 0.5-2.5 (3) MG/3ML IN SOLN
3.0000 mL | Freq: Once | RESPIRATORY_TRACT | Status: AC
  Administered 2018-09-15: 3 mL via RESPIRATORY_TRACT
  Filled 2018-09-15: qty 3

## 2018-09-15 MED ORDER — PROMETHAZINE-CODEINE 6.25-10 MG/5ML PO SYRP: 5.0000 mL | ORAL_SOLUTION | Freq: Four times a day (QID) | ORAL | 0 refills | Status: DC | PRN

## 2018-09-15 NOTE — ED Provider Notes (Signed)
[facility] Emergency Department Provider Note   ____________________________________________   First MD Initiated Contact with Patient 09/15/18 1745     (approximate)  I have reviewed the triage vital signs and the nursing notes.   HISTORY  Chief Complaint Wheezing    HPI Tiffany Hickman is a 38 y.o. female patient presents with asthma attack consist of wheezing [place] last night.  Patient with no relief of her inhalers.  Patient states she is having shortness of breath.  Patient also complain of a nonproductive cough.  Patient is denying chest pain.  No other palliative measure for complaint.      Past Medical History:  Diagnosis Date  . Abscess, peritonsillar   . Back pain   . Cubital tunnel syndrome   . DOE (dyspnea on exertion)   . Hidradenitis suppurativa   . Hypertension   . Hypertension   . Patellar tendinitis   . Prediabetes   . Stroke (HCC)   . Unspecified disorder of binocular vision     There are no active problems to display for this patient.   Past Surgical History:  Procedure Laterality Date  . SKIN GRAFT     LEFT FOOT    Prior to Admission medications   Medication Sig Start Date End Date Taking? Authorizing Provider  albuterol (PROVENTIL HFA;VENTOLIN HFA) 108 (90 Base) MCG/ACT inhaler Inhale 2 puffs into the lungs every 6 (six) hours as needed for wheezing or shortness of breath. 09/15/18   Joni Reining, PA-C  cyclobenzaprine (FLEXERIL) 5 MG tablet Take 1 tablet (5 mg total) by mouth 3 (three) times daily as needed for muscle spasms. 03/31/18   Menshew, Charlesetta Ivory, PA-C  hydrochlorothiazide (HYDRODIURIL) 25 MG tablet Take 25 mg by mouth daily.    [provider]  LORazepam (ATIVAN) 1 MG tablet Take 1 tablet (1 mg total) by mouth every 8 (eight) hours as needed for anxiety. 01/05/18 01/05/19  Emily Filbert, MD  metFORMIN (GLUCOPHAGE) 500 MG tablet Take 500 mg by mouth daily with breakfast.     [provider]  methylPREDNISolone (MEDROL DOSEPAK) 4 MG TBPK tablet Take Tapered dose as directed 09/15/18   Joni Reining, PA-C  oseltamivir (TAMIFLU) 75 MG capsule Take 1 capsule (75 mg total) by mouth 2 (two) times daily. 07/10/18   Fisher, Roselyn Bering, PA-C  promethazine-codeine (PHENERGAN WITH CODEINE) 6.25-10 MG/5ML syrup Take 5 mLs by mouth every 6 (six) hours as needed for cough. 09/15/18   Joni Reining, PA-C  pyridOXINE (VITAMIN B-6) 100 MG tablet Take 100 mg by mouth daily.    [provider]    Allergies Patient has no known allergies.  Family History  Problem Relation Age of Onset  . Diabetes Mother   . Hypertension Mother   . Hypertension Father   . COPD Father     Social History Social History   Tobacco Use  . Smoking status: Former Smoker    Years: 1.00  . Smokeless tobacco: Never Used  Substance Use Topics  . Alcohol use: Not Currently  . Drug use: No    Review of Systems Constitutional: No fever/chills Eyes: No visual changes. ENT: No sore throat. Cardiovascular: Denies chest pain. Respiratory: Wheezing, coughing, and mild dyspnea. Gastrointestinal: No abdominal pain.  No nausea, no vomiting.  No diarrhea.  No constipation. Genitourinary: Negative for dysuria. Musculoskeletal: Negative for back pain. Skin: Negative for rash.  Hidradenitis. Neurological: Negative for headaches, focal weakness or numbness. Endocrine:  Hypertension and prediabetes. ____________________________________________   PHYSICAL EXAM:  VITAL SIGNS: ED Triage Vitals  Enc Vitals Group     BP 09/15/18 1739 (!) 165/100     Pulse Rate 09/15/18 1739 (!) 114     Resp 09/15/18 1739 (!) 24     Temp 09/15/18 1739 98.9 F (37.2 C)     Temp Source 09/15/18 1739 Oral     SpO2 09/15/18 1739 100 %     Weight 09/15/18 1739 268 lb (121.6 kg)     Height 09/15/18 1739  (1.676 m)     Head Circumference --      Peak Flow --      Pain Score 09/15/18 1742 0     Pain  Loc --      Pain Edu? --      Excl. in GC? --     Constitutional: Alert and oriented.  Mild distress.  Morbid obesity. Neck: No stridor.  Hematological/Lymphatic/Immunilogical: No cervical lymphadenopathy. Cardiovascular: Normal rate, regular rhythm. Grossly normal heart sounds.  Good peripheral circulation. Respiratory: Normal respiratory effort.  No retractions. Lungs CTAB. Gastrointestinal: Soft and nontender. No distention. No abdominal bruits. No CVA tenderness. Musculoskeletal: No lower extremity tenderness nor edema.  No joint effusions. Neurologic:  Normal speech and language. No gross focal neurologic deficits are appreciated. No gait instability. Skin:  Skin is warm, dry and intact. No rash noted. Psychiatric: Mood and affect are normal. Speech and behavior are normal.  ____________________________________________   LABS (all labs ordered are listed, but only abnormal results are displayed)  Labs Reviewed - No data to display ____________________________________________  EKG   ____________________________________________  RADIOLOGY  ED MD interpretation:    Official radiology report(s): Dg Chest 2 View  Result Date: 09/15/2018 CLINICAL DATA:  Acute onset of wheezing. EXAM: CHEST - 2 VIEW COMPARISON:  None. FINDINGS: The lungs are well-aerated. Mild patchy bibasilar airspace opacities may reflect mild interstitial edema. Vascular congestion is noted. There is no evidence of pleural effusion or pneumothorax. The heart is normal in size; the mediastinal contour is within normal limits. No acute osseous abnormalities are seen. IMPRESSION: Mild patchy bibasilar airspace opacities may reflect mild interstitial edema. Vascular congestion noted. Electronically Signed   By: Roanna Raider M.D.   On: 09/15/2018 18:23    ____________________________________________   PROCEDURES  Procedure(s) performed (including Critical Care):  Procedures    ____________________________________________   INITIAL IMPRESSION / ASSESSMENT AND PLAN / ED COURSE  As part of my medical decision making, I reviewed the following data within the electronic MEDICAL RECORD NUMBER         Patient presents with cough and wheezing started last night.  Patient state only mild transient relief with inhaler.  Discussed x-ray findings with patient.  Physical exam is consistent with cough due to bronchospasm.  Patient given nebulized treatment and Decadron prior to departure.  Patient given prescription for Medrol Dosepak, Phenergan with codeine, and a refill of her inhaler.  Patient given discharge care instruction      ____________________________________________   FINAL CLINICAL IMPRESSION(S) / ED DIAGNOSES  Final diagnoses:  Cough due to bronchospasm     ED Discharge Orders         Ordered    methylPREDNISolone (MEDROL DOSEPAK) 4 MG TBPK tablet     09/15/18 1834    promethazine-codeine (PHENERGAN WITH CODEINE) 6.25-10 MG/5ML syrup  Every 6 hours PRN     09/15/18 1834    albuterol (PROVENTIL HFA;VENTOLIN HFA) 108 (90 Base)  MCG/ACT inhaler  Every 6 hours PRN     09/15/18 1834           Note:  This document was prepared using Dragon voice recognition software and may include unintentional dictation errors.    Joni Reining, PA-C 09/15/18 [place]    Dionne Bucy, MD 09/15/18 2103

## 2018-09-15 NOTE — ED Notes (Signed)

## 2018-09-15 NOTE — ED Triage Notes (Signed)
Pt reports asthma attack with wheezing since last night.  Pt using inhalers without relief.  Pt alert.

## 2018-10-11 ENCOUNTER — Other Ambulatory Visit: Payer: Self-pay

## 2018-10-11 ENCOUNTER — Emergency Department
Admission: EM | Admit: 2018-10-11 | Discharge: 2018-10-11 | Disposition: A | Discharge status: Home / Self Care | Payer: Medicaid Other | Attending: Emergency Medicine | Admitting: Emergency Medicine

## 2018-10-11 DIAGNOSIS — Z87891 Personal history of nicotine dependence: Secondary | ICD-10-CM | POA: Diagnosis not present

## 2018-10-11 DIAGNOSIS — I10 Essential (primary) hypertension: Secondary | ICD-10-CM | POA: Diagnosis not present

## 2018-10-11 DIAGNOSIS — J029 Acute pharyngitis, unspecified: Secondary | ICD-10-CM | POA: Diagnosis not present

## 2018-10-11 DIAGNOSIS — R7303 Prediabetes: Secondary | ICD-10-CM | POA: Diagnosis not present

## 2018-10-11 DIAGNOSIS — Z8673 Personal history of transient ischemic attack (TIA), and cerebral infarction without residual deficits: Secondary | ICD-10-CM | POA: Diagnosis not present

## 2018-10-11 DIAGNOSIS — R070 Pain in throat: Secondary | ICD-10-CM | POA: Diagnosis present

## 2018-10-11 MED ORDER — MAGIC MOUTHWASH: 15.0000 mL | Freq: Four times a day (QID) | ORAL | 0 refills | Status: DC | PRN

## 2018-10-11 MED ORDER — PENICILLIN V POTASSIUM 500 MG PO TABS: 500.0000 mg | ORAL_TABLET | Freq: Two times a day (BID) | ORAL | 0 refills | Status: AC

## 2018-10-11 NOTE — Discharge Instructions (Signed)
You may take Tylenol for mild to moderate pain, and fever.  The Magic mouthwash is to swish and swallow to decrease pain in your throat.  Please make sure you are drinking plenty of fluid to stay well-hydrated.  Continue to monitor your temperature and your symptoms.  If you develop cough, shortness of breath, or fever, please call your primary care physician.  Sometimes, sore throat is the first symptom of coronavirus.  It is imperative that you wash your hands frequently and thoroughly to prevent the spread of infection.  These maintain a 6 foot distance from others.  Return to the emergency department if you develop severe pain, lightheadedness or fainting, shortness of breath, inability to swallow, or any other symptoms concerning to you.

## 2018-10-11 NOTE — ED Triage Notes (Signed)
Pt c/o sore throat starting yesterday that has gotten progressively worse- denies cough, SHOB, fever

## 2018-10-11 NOTE — ED Provider Notes (Addendum)
[facility] Emergency Department Provider Note  ____________________________________________  Time seen: Approximately 11:21 AM  I have reviewed the triage vital signs and the nursing notes.   HISTORY  Chief Complaint Sore Throat    HPI Tiffany Hickman is a 38 y.o. female with a history of recurrent strep pharyngitis, prior peritonsillar abscess, HTN, prediabetes, presenting for sore throat.  The patient reports that for the past 3 days she has had a sore throat without any associated cough, fever, congestion or rhinorrhea, ear pain or shortness of breath.  She is able to swallow but it is painful.  She has not had any drooling or hoarse voice.  Past Medical History:  Diagnosis Date  . Abscess, peritonsillar   . Back pain   . Cubital tunnel syndrome   . DOE (dyspnea on exertion)   . Hidradenitis suppurativa   . Hypertension   . Hypertension   . Patellar tendinitis   . Prediabetes   . Stroke (HCC)   . Unspecified disorder of binocular vision     There are no active problems to display for this patient.   Past Surgical History:  Procedure Laterality Date  . SKIN GRAFT     LEFT FOOT    Current Outpatient Rx  . Order #: [id number] Class: Normal  . Order #: [id number] Class: Print  . Order #: [id number] Class: Historical Med  . Order #: [id number] Class: Print  . Order #: [id number] Class: Print  . Order #: [id number] Class: Historical Med  . Order #: [id number] Class: Normal  . Order #: [id number] Class: Print  . Order #: [id number] Class: Print  . Order #: [id number] Class: Normal  . Order #: [id number] Class: Historical Med    Allergies Patient has no known allergies.  Family History  Problem Relation Age of Onset  . Diabetes Mother   . Hypertension Mother   . Hypertension Father   . COPD Father     Social History Social History   Tobacco Use  . Smoking status: Former Smoker    Years: 1.00  . Smokeless tobacco: Never Used  Substance Use  Topics  . Alcohol use: Not Currently  . Drug use: No    Review of Systems Constitutional: No fever/chills.  No lightheadedness or syncope. Eyes: No visual changes. ENT: + sore throat pain with swallowing but no difficulty swallowing.  No congestion or rhinorrhea.  No ear pain..  Cardiovascular: Denies chest pain.  Respiratory: Denies shortness of breath.  No cough. Gastrointestinal: No abdominal pain.  No nausea, no vomiting.  No diarrhea.  No constipation. Genitourinary: Negative for dysuria. Musculoskeletal: Negative for back pain. Skin: Negative for rash. Neurological: Negative for headaches. No focal numbness, tingling or weakness.     ____________________________________________   PHYSICAL EXAM:  VITAL SIGNS: ED Triage Vitals  Enc Vitals Group     BP 10/11/18 1044 (!) 152/101     Pulse Rate 10/11/18 1044 91     Resp --      Temp 10/11/18 1044 99 F (37.2 C)     Temp Source 10/11/18 1044 Oral     SpO2 10/11/18 1044 100 %     Weight 10/11/18 1047 268 lb (121.6 kg)     Height 10/11/18 1047 5\' 6"  (1.676 m)     Head Circumference --      Peak Flow --      Pain Score 10/11/18 1046 10     Pain Loc --      Pain Edu? --      Excl. in  GC? --     Constitutional: Alert and oriented.  Answers questions appropriately. Eyes: Conjunctivae are normal.  EOMI. No scleral icterus.  No eye discharge. Head: Atraumatic. Nose: No congestion/rhinnorhea. Mouth/Throat: Mucous membranes are moist.  Posterior pharynx is grossly erythematous with enlarged tonsils but no exudate.  Her posterior palate is symmetric and the uvula is midline.  She has no drooling, trismus or stridor.   Neck: No stridor.  Supple.  No JVD.  No meningismus. Cardiovascular: Normal rate. Respiratory: Normal respiratory effort.  No accessory muscle use or retractions. Lungs CTAB.  No wheezes, rales or ronchi. Musculoskeletal: No LE edema.  Neurologic:  A&Ox3.  Speech is clear.  Face and smile are symmetric.  EOMI.   Moves all extremities well. Skin:  Skin is warm, dry and intact. No rash noted. Psychiatric: Mood and affect are normal. Speech and behavior are normal.  Normal judgement.  ____________________________________________   LABS (all labs ordered are listed, but only abnormal results are displayed)  Labs Reviewed - No data to display ____________________________________________  EKG  Not indicated ____________________________________________  RADIOLOGY  No results found.  ____________________________________________   PROCEDURES  Procedure(s) performed: None  Procedures  Critical Care performed: No ____________________________________________   INITIAL IMPRESSION / ASSESSMENT AND PLAN / ED COURSE  Pertinent labs & imaging results that were available during my care of the patient were reviewed by me and considered in my medical decision making (see chart for details).  38 y.o. female with a prior history of recurrent strep pharyngitis as well as peritonsillar abscess presenting with sore throat.  Overall, the patient is hypertensive at 152/101, but otherwise has a normal temperature, heart rate and oxygen saturation.  On my exam, her lung fields are clear.  Her posterior pharynx does show erythema and she meets Centor criteria for strep pharyngitis.  I have talked to her about the plan to initiate antibiotics, and that strep testing is not indicated given my high suspicion.  I would treat her with penicillin even if her strep testing was negative.  Today, I do not see any evidence of peritonsillar abscess.  However, she has had this in the past and is at increased risk.  She has been specifically counseled to return for any worsening symptoms, including shortness of breath, drooling, hoarse voice.  We also discussed the possibility of coronavirus with sore throat as an initiating symptom.  At this time, this is less likely, but I have reviewed strict handwashing precautions and  maintaining 6 feet of distance from others to prevent the spread of infection.  She has been given thorough follow-up instructions as well as return precautions.  Have counseled her to use Tylenol for pain and effervescence and to avoid NSAIDs.  Earl Lagosyshekia N Doverspike was evaluated in Emergency Department on 10/11/2018 for the symptoms described in the history of present illness. She was evaluated in the context of the global COVID-19 pandemic, which necessitated consideration that the patient might be at risk for infection with the SARS-CoV-2 virus that causes COVID-19. Institutional protocols and algorithms that pertain to the evaluation of patients at risk for COVID-19 are in a state of rapid change based on information released by regulatory bodies including the CDC and federal and state organizations. These policies and algorithms were followed during the patient's care in the ED.   ____________________________________________  FINAL CLINICAL IMPRESSION(S) / ED DIAGNOSES  Final diagnoses:  Pharyngitis, unspecified etiology         NEW MEDICATIONS STARTED DURING THIS VISIT:  New Prescriptions   MAGIC MOUTHWASH SOLN    Take 15 mLs by mouth 4 (four) times daily as needed for mouth pain.   PENICILLIN V POTASSIUM (VEETID) 500 MG TABLET    Take 1 tablet (500 mg total) by mouth 2 (two) times daily for 10 days.      Rockne Menghini, MD 10/11/18 1127    Rockne Menghini, MD 10/11/18 1140

## 2018-11-16 ENCOUNTER — Emergency Department: Payer: HRSA Program

## 2018-11-16 ENCOUNTER — Telehealth: Payer: Self-pay | Admitting: Internal Medicine

## 2018-11-16 ENCOUNTER — Emergency Department
Admission: EM | Admit: 2018-11-16 | Discharge: 2018-11-16 | Disposition: A | Discharge status: Home / Self Care | Payer: HRSA Program | Attending: Emergency Medicine | Admitting: Emergency Medicine

## 2018-11-16 ENCOUNTER — Other Ambulatory Visit: Payer: Self-pay | Admitting: *Deleted

## 2018-11-16 ENCOUNTER — Other Ambulatory Visit: Payer: Self-pay | Admitting: Oncology

## 2018-11-16 ENCOUNTER — Other Ambulatory Visit: Payer: Self-pay

## 2018-11-16 DIAGNOSIS — R0602 Shortness of breath: Secondary | ICD-10-CM | POA: Diagnosis present

## 2018-11-16 DIAGNOSIS — Z87891 Personal history of nicotine dependence: Secondary | ICD-10-CM | POA: Diagnosis not present

## 2018-11-16 DIAGNOSIS — J810 Acute pulmonary edema: Secondary | ICD-10-CM | POA: Insufficient documentation

## 2018-11-16 DIAGNOSIS — E119 Type 2 diabetes mellitus without complications: Secondary | ICD-10-CM | POA: Insufficient documentation

## 2018-11-16 DIAGNOSIS — Z7984 Long term (current) use of oral hypoglycemic drugs: Secondary | ICD-10-CM | POA: Insufficient documentation

## 2018-11-16 DIAGNOSIS — I10 Essential (primary) hypertension: Secondary | ICD-10-CM | POA: Diagnosis not present

## 2018-11-16 DIAGNOSIS — J45909 Unspecified asthma, uncomplicated: Secondary | ICD-10-CM | POA: Diagnosis not present

## 2018-11-16 DIAGNOSIS — Z20828 Contact with and (suspected) exposure to other viral communicable diseases: Secondary | ICD-10-CM | POA: Diagnosis not present

## 2018-11-16 DIAGNOSIS — N63 Unspecified lump in unspecified breast: Secondary | ICD-10-CM

## 2018-11-16 DIAGNOSIS — R590 Localized enlarged lymph nodes: Secondary | ICD-10-CM

## 2018-11-16 DIAGNOSIS — Z8673 Personal history of transient ischemic attack (TIA), and cerebral infarction without residual deficits: Secondary | ICD-10-CM | POA: Insufficient documentation

## 2018-11-16 LAB — COMPREHENSIVE METABOLIC PANEL
ALT: 14 U/L (ref 0–44)
AST: 19 U/L (ref 15–41)
Albumin: 3.7 g/dL (ref 3.5–5.0)
Alkaline Phosphatase: 82 U/L (ref 38–126)
Anion gap: 10 (ref 5–15)
BUN: 8 mg/dL (ref 6–20)
CO2: 25 mmol/L (ref 22–32)
Calcium: 9 mg/dL (ref 8.9–10.3)
Chloride: 105 mmol/L (ref 98–111)
Creatinine, Ser: 0.63 mg/dL (ref 0.44–1.00)
GFR calc Af Amer: >60 mL/min (ref >60)
GFR calc non Af Amer: >60 mL/min (ref >60)
Glucose, Bld: 119 mg/dL — ABNORMAL HIGH (ref 70–99)
Potassium: 4 mmol/L (ref 3.5–5.1)
Sodium: 140 mmol/L (ref 135–145)
Total Bilirubin: 0.6 mg/dL (ref 0.3–1.2)
Total Protein: 7.7 g/dL (ref 6.5–8.1)

## 2018-11-16 LAB — CBC
HCT: 35.8 % — ABNORMAL LOW (ref 36.0–46.0)
Hemoglobin: 11.4 g/dL — ABNORMAL LOW (ref 12.0–15.0)
MCH: 27.1 pg (ref 26.0–34.0)
MCHC: 31.8 g/dL (ref 30.0–36.0)
MCV: 85 fL (ref 80.0–100.0)
Platelets: 311 10*3/uL (ref 150–400)
RBC: 4.21 MIL/uL (ref 3.87–5.11)
RDW: 14.8 % (ref 11.5–15.5)
WBC: 16.1 10*3/uL — ABNORMAL HIGH (ref 4.0–10.5)
nRBC: 0 % (ref 0.0–0.2)

## 2018-11-16 LAB — TROPONIN I: Troponin I: <0.03 ng/mL (ref <0.03)

## 2018-11-16 LAB — BRAIN NATRIURETIC PEPTIDE: B Natriuretic Peptide: 181 pg/mL — ABNORMAL HIGH (ref 0.0–100.0)

## 2018-11-16 LAB — SARS CORONAVIRUS 2 BY RT PCR (HOSPITAL ORDER, PERFORMED IN ~~LOC~~ HOSPITAL LAB): SARS Coronavirus 2: NEGATIVE

## 2018-11-16 MED ORDER — FUROSEMIDE 20 MG PO TABS: 20.0000 mg | ORAL_TABLET | Freq: Every day | ORAL | 0 refills | Status: DC

## 2018-11-16 MED ORDER — IOHEXOL 350 MG/ML SOLN
75.0000 mL | Freq: Once | INTRAVENOUS | Status: AC | PRN
  Administered 2018-11-16: 75 mL via INTRAVENOUS
  Filled 2018-11-16: qty 75

## 2018-11-16 NOTE — Telephone Encounter (Signed)
Do any of y'all have a DOD spot for an in office visit this week for this patient per Dr. Okey Dupre?   I didn't see one that I would feel comfortable booking.   Thanks!

## 2018-11-16 NOTE — ED Provider Notes (Signed)
[facility] Emergency Department Provider Note  Time seen: 11:53 AM  I have reviewed the triage vital signs and the nursing notes.   HISTORY  Chief Complaint Shortness of Breath   HPI Tiffany Hickman is a 38 y.o. female with a past medical history of hypertension, asthma, presents to the emergency department for shortness of breath.  According to the patient she awoke last night feeling short of breath, states she used her albuterol inhaler several times overnight but did not achieve significant relief.  States this morning she was feeling more short of breath so she called EMS.  States EMS gave her a breathing treatment on the way to the hospital and she feels much better.  Denies any shortness of breath at this time.  Denies any chest pain at any point.  Patient does states she has been coughing today as well but relates this to asthma.  Denies any fever denies any congestion.   Past Medical History:  Diagnosis Date  . Abscess, peritonsillar   . Back pain   . Cubital tunnel syndrome   . DOE (dyspnea on exertion)   . Hidradenitis suppurativa   . Hypertension   . Hypertension   . Patellar tendinitis   . Prediabetes   . Stroke (HCC)   . Unspecified disorder of binocular vision     There are no active problems to display for this patient.   Past Surgical History:  Procedure Laterality Date  . SKIN GRAFT     LEFT FOOT    Prior to Admission medications   Medication Sig Start Date End Date Taking? Authorizing Provider  albuterol (PROVENTIL HFA;VENTOLIN HFA) 108 (90 Base) MCG/ACT inhaler Inhale 2 puffs into the lungs every 6 (six) hours as needed for wheezing or shortness of breath. 09/15/18   Joni Reining, PA-C  cyclobenzaprine (FLEXERIL) 5 MG tablet Take 1 tablet (5 mg total) by mouth 3 (three) times daily as needed for muscle spasms. 03/31/18   Menshew, Charlesetta Ivory, PA-C  hydrochlorothiazide (HYDRODIURIL) 25 MG tablet Take 25 mg by mouth daily.     [provider]  LORazepam (ATIVAN) 1 MG tablet Take 1 tablet (1 mg total) by mouth every 8 (eight) hours as needed for anxiety. 01/05/18 01/05/19  Emily Filbert, MD  magic mouthwash SOLN Take 15 mLs by mouth 4 (four) times daily as needed for mouth pain. 10/11/18   Rockne Menghini, MD  metFORMIN (GLUCOPHAGE) 500 MG tablet Take 500 mg by mouth daily with breakfast.    [provider]  methylPREDNISolone (MEDROL DOSEPAK) 4 MG TBPK tablet Take Tapered dose as directed 09/15/18   Joni Reining, PA-C  oseltamivir (TAMIFLU) 75 MG capsule Take 1 capsule (75 mg total) by mouth 2 (two) times daily. 07/10/18   Fisher, Roselyn Bering, PA-C  promethazine-codeine (PHENERGAN WITH CODEINE) 6.25-10 MG/5ML syrup Take 5 mLs by mouth every 6 (six) hours as needed for cough. 09/15/18   Joni Reining, PA-C  pyridOXINE (VITAMIN B-6) 100 MG tablet Take 100 mg by mouth daily.    [provider]    No Known Allergies  Family History  Problem Relation Age of Onset  . Diabetes Mother   . Hypertension Mother   . Hypertension Father   . COPD Father     Social History Social History   Tobacco Use  . Smoking status: Former Smoker    Years: 1.00  . Smokeless tobacco: Never Used  Substance Use Topics  . Alcohol  use: Not Currently  . Drug use: No    Review of Systems Constitutional: Negative for fever ENT: Negative for recent illness/congestion Cardiovascular: Negative for chest pain. Respiratory: Positive for shortness of breath.  Positive for cough. Gastrointestinal: Negative for vomiting Musculoskeletal: Negative for musculoskeletal complaints Skin: Negative for skin complaints  Neurological: Negative for headache All other ROS negative  ____________________________________________   PHYSICAL EXAM:  VITAL SIGNS: ED Triage Vitals  Enc Vitals Group     BP 11/16/18 1136 (!) 168/113     Pulse Rate 11/16/18 1136 (!) 109     Resp 11/16/18 1136 20     Temp 11/16/18 1136  98.4 F (36.9 C)     Temp Source 11/16/18 1136 Oral     SpO2 11/16/18 1136 100 %     Weight 11/16/18 1137 268 lb (121.6 kg)     Height 11/16/18 1137 5\' 6"  (1.676 m)     Head Circumference --      Peak Flow --      Pain Score 11/16/18 1137 0     Pain Loc --      Pain Edu? --      Excl. in GC? --    Constitutional: Alert and oriented. Well appearing and in no distress. Eyes: Normal exam ENT      Head: Normocephalic and atraumatic.      Mouth/Throat: Mucous membranes are moist. Cardiovascular: Normal rate, regular rhythm. Respiratory: Normal respiratory effort without tachypnea nor retractions. Breath sounds are clear, without current wheeze rales or rhonchi. Gastrointestinal: Soft and nontender. No distention.  Musculoskeletal: Nontender with normal range of motion in all extremities.  Neurologic:  Normal speech and language. No gross focal neurologic deficits are appreciated. Skin:  Skin is warm, dry and intact.  Psychiatric: Mood and affect are normal.  ____________________________________________   RADIOLOGY  Chest x-ray shows possible pulmonary edema.  CT scan shows asymmetric skin thickening along with enlarged lymph nodes consistent with possible breast cancer. No PE. Groundglass nodules in the lungs nonspecific.  ____________________________________________   INITIAL IMPRESSION / ASSESSMENT AND PLAN / ED COURSE  Pertinent labs & imaging results that were available during my care of the patient were reviewed by me and considered in my medical decision making (see chart for details).   Patient presents emergency department for shortness of breath.  Patient has a history of asthma, states she feels much better after her albuterol nebulizer treatment by EMS.  Patient states she has a nebulizer at home but could not find her mask so she could not use it last night.  Patient currently has clear lung sounds with a 100% room air saturation.  Given the patient's cough with  shortness of breath today we will obtain a chest x-ray as a precaution.  Highly suspect asthma exacerbation.  Patient currently appears well with a reassuring exam and reassuring vitals.  If chest x-ray is normal plan to discharge home with prednisone, nebulizer mask and solution.  Patient agreeable to plan of care.  Patient's labs show an elevated BNP along with a chest x-ray showing possible mild pulmonary edema.  I discussed the patient with Dr. Cristal Deerhristopher and of cardiology who recommends Lasix and they will follow-up this week for an echocardiogram.  Patient CT scan unfortunately also shows skin thickening with enlarged lymph nodes along with lung nodules consistent with possible metastatic breast cancer.  I discussed patient with Dr. Smith Robertao of oncology, she will call the patient tomorrow to arrange for a visit in office  tomorrow or the following day for the patient.  I discussed this with the patient who understands the findings and will follow-up as mentioned.  Tiffany Hickman was evaluated in Emergency Department on 11/16/2018 for the symptoms described in the history of present illness. She was evaluated in the context of the global COVID-19 pandemic, which necessitated consideration that the patient might be at risk for infection with the SARS-CoV-2 virus that causes COVID-19. Institutional protocols and algorithms that pertain to the evaluation of patients at risk for COVID-19 are in a state of rapid change based on information released by regulatory bodies including the CDC and federal and state organizations. These policies and algorithms were followed during the patient's care in the ED.  ____________________________________________   FINAL CLINICAL IMPRESSION(S) / ED DIAGNOSES  Dyspnea Asthma exacerbation Pulmonary edema Breast mass   Minna Antis, MD 11/16/18 1517

## 2018-11-16 NOTE — ED Notes (Signed)
md at bedside to discuss ct findings and follow up instructions.

## 2018-11-16 NOTE — Telephone Encounter (Signed)
Dr End,  Our schedule for Friday is filling. Do you mind if I put this patient at 1:30 pm? Then looks to be an opening for echo prior to.

## 2018-11-16 NOTE — Telephone Encounter (Signed)
Sure.  That is fine.  Sereniti Wan, MD [facility] Pager: [phone]  

## 2018-11-16 NOTE — ED Triage Notes (Signed)
Reports was not able to sleep last night due to SOB, no relief with rescue inhaler. albuterol given in route to ed by ems, patient reports positive relief.

## 2018-11-16 NOTE — ED Notes (Addendum)
Awaiting lab results, presently feeling better. Patient hypertensive, reports hx of high b/p, did not take her medications this morning.  Safety maintained. Will continue to monitor

## 2018-11-16 NOTE — Telephone Encounter (Signed)
Scheduled

## 2018-11-16 NOTE — ED Notes (Signed)
chest xray completed.

## 2018-11-16 NOTE — Telephone Encounter (Signed)
Patient presented to ED with worsening shortness of breath.  CXR concerning for mild cardiomegaly and pulmonary edema.  BNP also noted to be elevated (181).  Patient given bronchodilators with improvement in symptoms.  CTA of the chest then performed, showing possible right breast cancer (new diagnosis).  No evidence of PE.  If dyspnea has resolved and patient is otherwise without complaints, I think it is reasonable to arrange for close outpatient f/u in our office with echo at that time.  Initiation of low-dose furosemide (e.g. furosemide 20 mg PO daily) should be considered.  If patient worsens clinically, admission for further workup should be pursued.  Yvonne Kendall, MD [facility] Pager: [phone]

## 2018-11-16 NOTE — ED Notes (Signed)
Ct chest completed.

## 2018-11-16 NOTE — Discharge Instructions (Signed)
As we discussed please call the number for oncology tomorrow morning to arrange a follow-up appointment tomorrow or the following day to discuss your CT findings and further work-up that is needed regarding your breast mass.  Please also call the number tomorrow morning for cardiology to arrange a follow-up appointment this week for recheck/reevaluation as well as a ultrasound/echocardiogram of your heart.  Return to the emergency department for any return of difficulty breathing, development of any chest pain, or any other symptom personally concerning to yourself.

## 2018-11-16 NOTE — Telephone Encounter (Signed)
Echo order entered.  I'm going to go ahead and put patient in the appointment slot with Dr End.  Scheduling,  Could you please call patient to make sure this time is ok and that it is ok to have the echo in the prior to the appointment?  Thanks!

## 2018-11-16 NOTE — Addendum Note (Signed)
Addended by: Stann Mainland on: 11/16/2018 04:17 PM   Modules accepted: Orders

## 2018-11-16 NOTE — ED Notes (Signed)
Md at bedside to discuss CT chest.

## 2018-11-17 ENCOUNTER — Encounter: Payer: Self-pay | Admitting: *Deleted

## 2018-11-17 ENCOUNTER — Inpatient Hospital Stay: Payer: Self-pay | Attending: Oncology | Admitting: Oncology

## 2018-11-17 NOTE — Progress Notes (Signed)
Patient missed her appointment.  Called her to reschedule.  Left message for her to return my call.  I also called her PCP at the [facility].  They are also going to call the patient to reschedule.

## 2018-11-19 ENCOUNTER — Other Ambulatory Visit: Payer: Self-pay

## 2018-11-19 ENCOUNTER — Ambulatory Visit: Payer: Self-pay | Admitting: Internal Medicine

## 2018-11-19 NOTE — Progress Notes (Deleted)
New Outpatient Visit Date: 11/19/2018  Referring Provider: Minna Antis, MD [facility] Emergency Department  Chief Complaint: ***  HPI:  [name] is a 38 y.o. female who is being seen today for the evaluation of shortness of breath and suspected heart failure at the request of Dr Lenard Lance. She has a history of hypertension and asthma.  She presented to the [facility] emergency department 3 days ago with shortness of breath that did not improve with her albuterol inhaler.  She was given a nebulizer treatment by EMS prior to arrival in the emergency department with improvement in her dyspnea.  ED work-up was notable for elevated BNP as well as chest radiograph showing possible mild pulmonary edema.  CTA of the chest also showed asymmetric skin thickening and nodularity of the right medial breast with associated right axillary and subpectoral lymph node enlargement suspicious for breast cancer with nodal metastases (new diagnosis).  She was discharged on low-dose furosemide and referred to [place] and oncology for further assessment.  She missed her oncology appointment the following day.  --------------------------------------------------------------------------------------------------  Cardiovascular History & Procedures: Cardiovascular Problems:  Possible heart failure  Risk Factors:  Hypertension, morbid obesity, and prior tobacco use  Cath/PCI:  None  CV Surgery:  None  EP Procedures and Devices:  None  Non-Invasive Evaluation(s):  None  Recent CV Pertinent Labs: Lab Results  Component Value Date   BNP 181.0 (H) 11/16/2018   K 4.0 11/16/2018   K 3.7 12/29/2011   BUN 8 11/16/2018   BUN 7 12/29/2011   CREATININE 0.63 11/16/2018   CREATININE 0.66 12/29/2011    --------------------------------------------------------------------------------------------------  Past Medical History:  Diagnosis Date  . Abscess, peritonsillar   . Back pain    . Cubital tunnel syndrome   . DOE (dyspnea on exertion)   . Hidradenitis suppurativa   . Hypertension   . Hypertension   . Patellar tendinitis   . Prediabetes   . Stroke (HCC)   . Unspecified disorder of binocular vision     Past Surgical History:  Procedure Laterality Date  . SKIN GRAFT     LEFT FOOT    No outpatient medications have been marked as taking for the 11/19/18 encounter (Appointment) with Atley Scarboro, Cristal Deer, MD.    Allergies: Patient has no known allergies.  Social History   Tobacco Use  . Smoking status: Former Smoker    Years: 1.00  . Smokeless tobacco: Never Used  Substance Use Topics  . Alcohol use: Not Currently  . Drug use: No    Family History  Problem Relation Age of Onset  . Diabetes Mother   . Hypertension Mother   . Hypertension Father   . COPD Father     Review of Systems: A 12-system review of systems was performed and was negative except as noted in the HPI.  --------------------------------------------------------------------------------------------------  Physical Exam: LMP 10/21/2018 (Exact Date) Comment: denies chance of pregnancy  General:  *** HEENT: No conjunctival pallor or scleral icterus. Moist mucous membranes. OP clear. Neck: Supple without lymphadenopathy, thyromegaly, JVD, or HJR. No carotid bruit. Lungs: Normal work of breathing. Clear to auscultation bilaterally without wheezes or crackles. Heart: Regular rate and rhythm without murmurs, rubs, or gallops. Non-displaced PMI. Abd: Bowel sounds present. Soft, NT/ND without hepatosplenomegaly Ext: No lower extremity edema. Radial, PT, and DP pulses are 2+ bilaterally Skin: Warm and dry without rash. Neuro: CNIII-XII intact. Strength and fine-touch sensation intact in upper and lower extremities bilaterally. Psych: Normal mood and affect.  EKG:  ***  Lab Results  Component Value Date   WBC 16.1 (H) 11/16/2018   HGB 11.4 (L) 11/16/2018   HCT 35.8 (L) 11/16/2018    MCV 85.0 11/16/2018   PLT 311 11/16/2018    Lab Results  Component Value Date   NA 140 11/16/2018   K 4.0 11/16/2018   CL 105 11/16/2018   CO2 25 11/16/2018   BUN 8 11/16/2018   CREATININE 0.63 11/16/2018   GLUCOSE 119 (H) 11/16/2018   ALT 14 11/16/2018    No results found for: CHOL, HDL, LDLCALC, LDLDIRECT, TRIG, CHOLHDL   --------------------------------------------------------------------------------------------------  ASSESSMENT AND PLAN: ***  Tiffany Kendallhristopher Gabriella Woodhead, MD 11/19/2018 7:09 AM

## 2018-11-24 ENCOUNTER — Ambulatory Visit: Payer: Self-pay

## 2018-11-24 ENCOUNTER — Other Ambulatory Visit: Payer: Self-pay

## 2018-12-21 ENCOUNTER — Other Ambulatory Visit: Payer: Self-pay

## 2018-12-22 ENCOUNTER — Ambulatory Visit
Admission: RE | Admit: 2018-12-22 | Discharge: 2018-12-22 | Disposition: A | Discharge status: Home / Self Care | Payer: Self-pay | Source: Ambulatory Visit | Attending: Oncology | Admitting: Oncology

## 2018-12-22 ENCOUNTER — Encounter: Payer: Self-pay | Admitting: *Deleted

## 2018-12-22 ENCOUNTER — Ambulatory Visit: Payer: Self-pay | Attending: Oncology | Admitting: *Deleted

## 2018-12-22 ENCOUNTER — Other Ambulatory Visit: Payer: Self-pay

## 2018-12-22 VITALS — BP 151/99 | HR 76 | Temp 98.5°F | Ht 66.0 in | Wt 270.0 lb

## 2018-12-22 DIAGNOSIS — Z0000 Encounter for general adult medical examination without abnormal findings: Secondary | ICD-10-CM | POA: Insufficient documentation

## 2018-12-22 NOTE — Progress Notes (Signed)
Called patient to inform her of her negative mammogram results.  Patient was very relieved.  Reminded her of her upcoming appointment with Dr. Janese Banks on 12/27/18 or to follow-up with primary care.  Patient states she will fill out patient financial paperwork and see Dr. Janese Banks.  HSIS to [place].

## 2018-12-22 NOTE — Progress Notes (Signed)
  Subjective:     Patient ID: Tiffany Hickman, female   DOB: June 22, 1981, 38 y.o.   MRN: [id number]  HPI   Review of Systems     Objective:   Physical Exam Chest:     Breasts:        Right: No swelling, bleeding, inverted nipple, mass, nipple discharge, skin change or tenderness.        Left: No swelling, bleeding, inverted nipple, mass, nipple discharge, skin change or tenderness.          Assessment:     Patient was a self referral because she did not have insurance and had had a recent CT scan showing right breast skin thickening and lymphadenopathy, as well as ground glass opacities in the lung.  On clinical breast exam today there is red tender pustule "boil" like areas in the right axilla.  There is notable scarring from history of what looks like hidradenitis. Thre is no dominant mass, nipple discharge or lymphadenopathy.  Patient states she has been getting "boils" since age 51 under both axilla, her breast and the groin area.  States she usually just treats it with warm compresses.  Encouraged her to go to her primary care physician at the [facility] for treatment, but she states she has some antibiotics at home and will use what she has.  Discussed the need for follow up of the ground glass opacities in her lungs.  She is scheduled to see Dr. Janese Banks next week, or discussed follow-up with her primary care at the minimum since she has no insurance.  She is agreeable to follow up lung CT.  Patient has been screened for eligibility.  She does not have any insurance, Medicare or Medicaid.  She also meets financial eligibility.  Hand-out given on the Affordable Care Act. Risk Assessment    Risk Scores      12/22/2018   Last edited by: Theodore Demark, RN   5-year risk: 0.5 %   Lifetime risk: 9.7 %            Plan:      Screening mammogram ordered.  Will follow up per [facility] protocol.

## 2018-12-24 ENCOUNTER — Other Ambulatory Visit: Payer: Self-pay

## 2018-12-27 ENCOUNTER — Inpatient Hospital Stay: Payer: HRSA Program | Attending: Oncology | Admitting: Oncology

## 2019-01-04 ENCOUNTER — Other Ambulatory Visit: Payer: Self-pay

## 2019-02-02 ENCOUNTER — Other Ambulatory Visit: Payer: Self-pay

## 2019-02-02 ENCOUNTER — Emergency Department
Admission: EM | Admit: 2019-02-02 | Discharge: 2019-02-02 | Disposition: A | Discharge status: Home / Self Care | Payer: Medicaid Other | Attending: Emergency Medicine | Admitting: Emergency Medicine

## 2019-02-02 ENCOUNTER — Encounter: Payer: Self-pay | Admitting: Emergency Medicine

## 2019-02-02 DIAGNOSIS — K047 Periapical abscess without sinus: Secondary | ICD-10-CM | POA: Insufficient documentation

## 2019-02-02 DIAGNOSIS — Z87891 Personal history of nicotine dependence: Secondary | ICD-10-CM | POA: Diagnosis not present

## 2019-02-02 DIAGNOSIS — I10 Essential (primary) hypertension: Secondary | ICD-10-CM | POA: Diagnosis not present

## 2019-02-02 DIAGNOSIS — Z79899 Other long term (current) drug therapy: Secondary | ICD-10-CM | POA: Insufficient documentation

## 2019-02-02 DIAGNOSIS — K0889 Other specified disorders of teeth and supporting structures: Secondary | ICD-10-CM | POA: Diagnosis present

## 2019-02-02 MED ORDER — TRAMADOL HCL 50 MG PO TABS
50.0000 mg | ORAL_TABLET | Freq: Once | ORAL | Status: AC
  Administered 2019-02-02: 22:00:00 50 mg via ORAL
  Filled 2019-02-02: qty 1

## 2019-02-02 MED ORDER — TRAMADOL HCL 50 MG PO TABS: 50.0000 mg | ORAL_TABLET | Freq: Four times a day (QID) | ORAL | 0 refills | Status: AC | PRN

## 2019-02-02 MED ORDER — CLINDAMYCIN HCL 300 MG PO CAPS: 300.0000 mg | ORAL_CAPSULE | Freq: Three times a day (TID) | ORAL | 0 refills | Status: AC

## 2019-02-02 MED ORDER — CLINDAMYCIN PHOSPHATE 600 MG/4ML IJ SOLN
600.0000 mg | Freq: Once | INTRAMUSCULAR | Status: AC
  Administered 2019-02-02: 600 mg via INTRAMUSCULAR
  Filled 2019-02-02: qty 4

## 2019-02-02 NOTE — Discharge Instructions (Signed)
OPTIONS FOR DENTAL FOLLOW UP CARE ° °[place] Department of Health and Human Services - Local Safety Net Dental Clinics °http://www.ncdhhs.gov/dph/oralhealth/services/safetynetclinics.htm °  °[facility] (336-562-3123) ° °[facility] [place] (919-933-9087) ° °[facility] ([phone] ext [phone]) ° °[facility] ([phone]) ° °[facility] ([phone]) °This clinic caters to the indigent population and is on a lottery system. °Location: °[facility], [address], [place] °Clinic Hours: °Wednesdays from 6pm - 9pm, patients seen by a lottery system. °For dates, call or go to www.med.[facility].edu/[facility]/patients/Dental-[facility] °Services: °Cleanings, fillings and simple extractions. °Payment Options: °DENTAL WORK IS FREE OF CHARGE. Bring proof of income or support. °Best way to get seen: °Arrive at 5:15 pm - this is a lottery, NOT first come/first serve, so arriving earlier will not increase your chances of being seen. °  °  °[facility] °[phone] °Select option 1 for emergencies °  °Location: °[facility], [address], [place] °Clinic Hours: °No walk-ins accepted - call the day before to schedule an appointment. °Check in times are 9:30 am and 1:30 pm. °Services: °Simple extractions, temporary fillings, pulpectomy/pulp debridement, uncomplicated abscess drainage. °Payment Options: °PAYMENT IS DUE AT THE TIME OF SERVICE.  Fee is usually $100-200, additional surgical procedures (e.g. abscess drainage) may be extra. °Cash, checks, Visa/MasterCard accepted.  Can file Medicaid if patient is covered for dental - patient should call case worker to check. °No discount for [facility] patients. °Best way to get seen: °MUST call the day before and get onto the schedule. Can usually be seen the next 1-2 days. No walk-ins accepted. °  °  °[facility] °[phone] °   °Location: °[facility], [address], [place] °Clinic Hours: °M, W, Th, F 8am or 1:30pm, Tues 9a or 1:30 - first come/first served. °Services: °Simple extractions, temporary fillings, uncomplicated abscess drainage.  You do not need to be an [place] resident. °Payment Options: °PAYMENT IS DUE AT THE TIME OF SERVICE. °Dental insurance, otherwise sliding scale - bring proof of income or support. °Depending on income and treatment needed, cost is usually $50-200. °Best way to get seen: °Arrive early as it is first come/first served. °  °  °[facility] °[phone] °  °Location: °[address] °Clinic Hours: °Mon-Thu 8a-5p °Services: °Most basic dental services including extractions and fillings. °Payment Options: °PAYMENT IS DUE AT THE TIME OF SERVICE. °Sliding scale, up to 50% off - bring proof if income or support. °Medicaid with dental option accepted. °Best way to get seen: °Call to schedule an appointment, can usually be seen within 2 weeks OR they will try to see walk-ins - show up at 8a or 2p (you may have to wait). °  °  °[facility] °919-245-2435 °[place] RESIDENTS ONLY °  °Location: °[facility], [address], [place],  [address] °Clinic Hours: By appointment only. °Monday - Thursday 8am-5pm, Friday 8am-12pm °Services: Cleanings, fillings, extractions. °Payment Options: °PAYMENT IS DUE AT THE TIME OF SERVICE. °Cash, Visa or MasterCard. Sliding scale - $30 minimum per service. °Best way to get seen: °Come in to office, complete packet and make an appointment - need proof of income °or support monies for each household member and proof of [place] residence. °Usually takes about a month to get in. °  °  °[facility] °[phone] °  °Location: °[address]   [place] °Clinic Hours: Walk-in Urgent Care Dental Services are offered Monday-Friday  mornings only. °The numbers of emergencies accepted daily is limited to the number of °providers available. °Maximum 15 - Mondays, Wednesdays & Thursdays °Maximum 10 - Tuesdays & Fridays °Services: °You do not need to be a [place] resident to be seen for a dental emergency. °Emergencies are defined as pain, swelling, abnormal bleeding, or dental trauma. Walkins will receive x-rays if needed. °NOTE: Dental cleaning is not an emergency. °Payment Options: °PAYMENT IS DUE AT THE TIME OF SERVICE. °Minimum co-pay is $40.00 for uninsured patients. °Minimum co-pay is $3.00 for Medicaid with dental coverage. °Dental Insurance is accepted and must be presented at time of visit. °Medicare does not cover dental. °Forms of payment: Cash, credit card, checks. °Best way to get seen: °If not previously registered with the clinic, walk-in dental registration begins at 7:15 am and is on a first come/first serve basis. °If previously registered with the clinic, call to make an appointment. °  °  °[facility] °919-776-4359 °[place] RESIDENTS ONLY °  °Location: °[address], [place], [place] °Clinic Hours: °Mon-Thu 10a-2p °Services: Extractions only! °Payment Options: °FREE (donations accepted) - bring proof of income or support °Best way to get seen: °Call and schedule an appointment OR come at 8am on the 1st Monday of every month (except for holidays) when it is first come/first served. °  °  °[facility] °[phone] °  °Location: °[address], [place] °Clinic Hours: °Friday mornings °Services, Payment Options, Best way to get seen: °Call for info °

## 2019-02-02 NOTE — ED Triage Notes (Signed)
Pt arrives via ems from home with concerns over dental pain and swelling to pt's upper right side of her face. Pt in NAD. Pt states the pain started 2 days prior.

## 2019-02-02 NOTE — ED Provider Notes (Addendum)
[facility] Emergency Department Provider Note  ____________________________________________  Time seen: Approximately 9:29 PM  I have reviewed the triage vital signs and the nursing notes.   HISTORY  Chief Complaint Dental Pain    HPI Tiffany Hickman is a 38 y.o. female with a history of prior dental abscess, presents to the emergency department with swelling of the right upper jaw.  Patient states that she has had pain surrounding Superior 4.  She has had no difficulty swallowing or pain underneath the tongue.  She has had some chills at home.  She anticipates making an appointment with a local dentist sometime this week.  No other alleviating measures have been attempted.        Past Medical History:  Diagnosis Date  . Abscess, peritonsillar   . Back pain   . Cubital tunnel syndrome   . DOE (dyspnea on exertion)   . Hidradenitis suppurativa   . Hypertension   . Hypertension   . Patellar tendinitis   . Prediabetes   . Stroke ([place])   . Unspecified disorder of binocular vision     There are no active problems to display for this patient.   Past Surgical History:  Procedure Laterality Date  . SKIN GRAFT     LEFT FOOT    Prior to Admission medications   Medication Sig Start Date End Date Taking? Authorizing Provider  albuterol (PROVENTIL HFA;VENTOLIN HFA) 108 (90 Base) MCG/ACT inhaler Inhale 2 puffs into the lungs every 6 (six) hours as needed for wheezing or shortness of breath. 09/15/18   Sable Feil, PA-C  clindamycin (CLEOCIN) 300 MG capsule Take 1 capsule (300 mg total) by mouth 3 (three) times daily for 10 days. 02/02/19 02/12/19  Lannie Fields, PA-C  cyclobenzaprine (FLEXERIL) 5 MG tablet Take 1 tablet (5 mg total) by mouth 3 (three) times daily as needed for muscle spasms. 03/31/18   Menshew, Dannielle Karvonen, PA-C  furosemide (LASIX) 20 MG tablet Take 1 tablet (20 mg total) by mouth daily for 20 days. 11/16/18 12/06/18  Harvest Dark, MD  hydrochlorothiazide (HYDRODIURIL) 25 MG tablet Take 25 mg by mouth daily.    [provider]  magic mouthwash SOLN Take 15 mLs by mouth 4 (four) times daily as needed for mouth pain. 10/11/18   Eula Listen, MD  metFORMIN (GLUCOPHAGE) 500 MG tablet Take 500 mg by mouth daily with breakfast.    [provider]  methylPREDNISolone (MEDROL DOSEPAK) 4 MG TBPK tablet Take Tapered dose as directed 09/15/18   Sable Feil, PA-C  oseltamivir (TAMIFLU) 75 MG capsule Take 1 capsule (75 mg total) by mouth 2 (two) times daily. 07/10/18   Fisher, Linden Dolin, PA-C  promethazine-codeine (PHENERGAN WITH CODEINE) 6.25-10 MG/5ML syrup Take 5 mLs by mouth every 6 (six) hours as needed for cough. 09/15/18   Sable Feil, PA-C  pyridOXINE (VITAMIN B-6) 100 MG tablet Take 100 mg by mouth daily.    [provider]  traMADol (ULTRAM) 50 MG tablet Take 1 tablet (50 mg total) by mouth every 6 (six) hours as needed for up to 3 days. 02/02/19 02/05/19  Lannie Fields, PA-C    Allergies Patient has no known allergies.  Family History  Problem Relation Age of Onset  . Diabetes Mother   . Hypertension Mother   . Hypertension Father   . COPD Father     Social History Social History   Tobacco Use  . Smoking status: Former Smoker  Years: 1.00  . Smokeless tobacco: Never Used  Substance Use Topics  . Alcohol use: Not Currently  . Drug use: No     Review of Systems  Constitutional: No fever/chills Eyes: No visual changes. No discharge ENT: Patient has swelling of right upper jaw.  Cardiovascular: no chest pain. Respiratory: no cough. No SOB. Gastrointestinal: No abdominal pain.  No nausea, no vomiting.  No diarrhea.  No constipation. Musculoskeletal: Negative for musculoskeletal pain. Skin: Negative for rash, abrasions, lacerations, ecchymosis. Neurological: Negative for headaches, focal weakness or  numbness.   ____________________________________________   PHYSICAL EXAM:  VITAL SIGNS: ED Triage Vitals  Enc Vitals Group     BP 02/02/19 1944 124/74     Pulse Rate 02/02/19 1944 93     Resp 02/02/19 1944 18     Temp 02/02/19 1944 99.3 F (37.4 C)     Temp Source 02/02/19 1944 Oral     SpO2 02/02/19 1944 100 %     Weight 02/02/19 1945 270 lb (122.5 kg)     Height 02/02/19 1945 5\' 6"  (1.676 m)     Head Circumference --      Peak Flow --      Pain Score 02/02/19 1944 9     Pain Loc --      Pain Edu? --      Excl. in GC? --      Constitutional: Alert and oriented. Well appearing and in no acute distress. Eyes: Conjunctivae are normal. PERRL. EOMI. Head: Atraumatic. ENT:      Nose: No congestion/rhinnorhea.      Mouth/Throat: Mucous membranes are moist.  Patient has swelling of right upper jaw. Swelling extends to right upper cheek with no periocular involvement.  Patient has dental caries of superior 4 with no induration or palpable fluctuance of surrounding gingiva that would warrant bedside incision and drainage.  No pain to palpation underneath the tongue.  No swelling at the neck. Patient swallows easily during exam.  Cardiovascular: Normal rate, regular rhythm. Normal S1 and S2.  Good peripheral circulation. Respiratory: Normal respiratory effort without tachypnea or retractions. Lungs CTAB. Good air entry to the bases with no decreased or absent breath sounds. Skin:  Skin is warm, dry and intact. No rash noted. Psychiatric: Mood and affect are normal. Speech and behavior are normal. Patient exhibits appropriate insight and judgement.   ____________________________________________   LABS (all labs ordered are listed, but only abnormal results are displayed)  Labs Reviewed - No data to display ____________________________________________  EKG   ____________________________________________  RADIOLOGY   No results  found.  ____________________________________________    PROCEDURES  Procedure(s) performed:    Procedures    Medications  clindamycin (CLEOCIN) injection 600 mg (has no administration in time range)  traMADol (ULTRAM) tablet 50 mg (50 mg Oral Given 02/02/19 2149)     ____________________________________________   INITIAL IMPRESSION / ASSESSMENT AND PLAN / ED COURSE  Pertinent labs & imaging results that were available during my care of the patient were reviewed by me and considered in my medical decision making (see chart for details).  Review of the [place] CSRS was performed in accordance of the [facility] prior to dispensing any controlled drugs.           Assessment and plan Dental abscess 38 year old female presents to the emergency department with swelling of the right upper jaw for the past several days.  Patient had low-grade fever at triage but vital signs are otherwise stable.  On physical  exam, patient had swelling of the right upper jaw and dental caries affecting superior 4. Swelling extended to superior aspect of right cheek with no periocular involvement.  Patient had no palpable fluctuance or induration of gingiva surrounding Superior 4 that would warrant bedside incision and drainage.   Patient was given IM clindamycin in the emergency department and she was discharged with clindamycin and a short course of tramadol for pain.  She was advised to make an appointment with a local dentist as soon as possible.  All patient questions were answered.    ____________________________________________  FINAL CLINICAL IMPRESSION(S) / ED DIAGNOSES  Final diagnoses:  Dental abscess      NEW MEDICATIONS STARTED DURING THIS VISIT:  ED Discharge Orders         Ordered    clindamycin (CLEOCIN) 300 MG capsule  3 times daily     02/02/19 2134    traMADol (ULTRAM) 50 MG tablet  Every 6 hours PRN     02/02/19 2134              This chart was dictated using  voice recognition software/Dragon. Despite best efforts to proofread, errors can occur which can change the meaning. Any change was purely unintentional.    Orvil FeilWoods, Buddie Marston M, PA-C 02/02/19 2203    Pia MauWoods, Diania Co Lido BeachM, PA-C 02/04/19 1517    Concha SeFunke, Mary E, MD 02/07/19 [id number]

## 2019-02-11 ENCOUNTER — Ambulatory Visit (INDEPENDENT_AMBULATORY_CARE_PROVIDER_SITE_OTHER): Payer: Medicaid Other

## 2019-02-11 ENCOUNTER — Other Ambulatory Visit: Payer: Self-pay

## 2019-02-11 DIAGNOSIS — R0602 Shortness of breath: Secondary | ICD-10-CM

## 2019-02-17 ENCOUNTER — Telehealth: Payer: Self-pay | Admitting: *Deleted

## 2019-02-17 NOTE — Telephone Encounter (Signed)
No answer. Left message to call back.   

## 2019-02-17 NOTE — Telephone Encounter (Signed)
-----   Message from [name], MD sent at 02/14/2019  6:59 AM EDT ----- Please let the patient know that her echo shows normal heart function.  There is mild thickening and stiffening of the left ventricle.  There is significant narrowing (stenosis) of the mitral valve, which could be contributing to her shortness of breath and need further evaluation.  I see that she was a no-show for her prior appointment with me but is scheduled to see Dr. Saralyn Pilar today.  I recommend that she move forward with cardiology evaluation as soon as possible (whether it be with [place] or another practice).

## 2019-02-18 ENCOUNTER — Telehealth: Payer: Self-pay | Admitting: Internal Medicine

## 2019-02-18 NOTE — Telephone Encounter (Signed)
Patient is scheduled for a Video Visit with Dr. Saunders Revel on 02/24/19 at 8:40 am.    Virtual Visit Pre-Appointment Phone Call  "(Name), I am calling you today to discuss your upcoming appointment. We are currently trying to limit exposure to the virus that causes COVID-19 by seeing patients at home rather than in the office."  1. "What is the BEST phone number to call the day of the visit?" - include this in appointment notes  2. "Do you have or have access to (through a family member/friend) a smartphone with video capability that we can use for your visit?" a. If yes - list this number in appt notes as "cell" (if different from BEST phone #) and list the appointment type as a VIDEO visit in appointment notes b. If no - list the appointment type as a PHONE visit in appointment notes  3. Confirm consent - "In the setting of the current Covid19 crisis, you are scheduled for a (phone or video) visit with your provider on (date) at (time).  Just as we do with many in-office visits, in order for you to participate in this visit, we must obtain consent.  If you'd like, I can send this to your mychart (if signed up) or email for you to review.  Otherwise, I can obtain your verbal consent now.  All virtual visits are billed to your insurance company just like a normal visit would be.  By agreeing to a virtual visit, we'd like you to understand that the technology does not allow for your provider to perform an examination, and thus may limit your provider's ability to fully assess your condition. If your provider identifies any concerns that need to be evaluated in person, we will make arrangements to do so.  Finally, though the technology is pretty good, we cannot assure that it will always work on either your or our end, and in the setting of a video visit, we may have to convert it to a phone-only visit.  In either situation, we cannot ensure that we have a secure connection.  Are you willing to proceed?" STAFF:  Did the patient verbally acknowledge consent to telehealth visit? Document YES/NO here: Yes  4. Advise patient to be prepared - "Two hours prior to your appointment, go ahead and check your blood pressure, pulse, oxygen saturation, and your weight (if you have the equipment to check those) and write them all down. When your visit starts, your provider will ask you for this information. If you have an Apple Watch or Kardia device, please plan to have heart rate information ready on the day of your appointment. Please have a pen and paper handy nearby the day of the visit as well."  5. Give patient instructions for MyChart download to smartphone OR [email or web address] as below if video visit (depending on what platform provider is using)  6. Inform patient they will receive a phone call 15 minutes prior to their appointment time (may be from unknown caller ID) so they should be prepared to answer    TELEPHONE CALL NOTE  Tiffany Hickman has been deemed a candidate for a follow-up tele-health visit to limit community exposure during the Covid-19 pandemic. I spoke with the patient via phone to ensure availability of phone/video source, confirm preferred email & phone number, and discuss instructions and expectations.  I reminded Tiffany Hickman to be prepared with any vital sign and/or heart rhythm information that could potentially be obtained via home monitoring, at the  time of her visit. I reminded Tiffany Hickman to expect a phone call prior to her visit.  Tiffany RadHeather Gennie Dib, RN 02/18/2019 1:28 PM

## 2019-02-18 NOTE — Telephone Encounter (Signed)
I spoke with the patient regarding her echo results. She states she did not see Dr. Josefa Half at [facility] She is agreeable with following back up with Dr. Saunders Revel. She would like to do an e-visit. I have scheduled her for a video visit on 02/24/19 at 8:40 am.   Consent obtained.

## 2019-02-23 NOTE — Progress Notes (Signed)
Virtual Visit via Telephone Note   This visit type was conducted due to national recommendations for restrictions regarding the COVID-19 Pandemic (e.g. social distancing) in an effort to limit this patient's exposure and mitigate transmission in our community.  Due to her co-morbid illnesses, this patient is at least at moderate risk for complications without adequate follow up.  This format is felt to be most appropriate for this patient at this time.  The patient did not have access to video technology/had technical difficulties with video requiring transitioning to audio format only (telephone).  All issues noted in this document were discussed and addressed.  No physical exam could be performed with this format.  Please refer to the patient's chart for her  consent to telehealth [facility].   Date:  02/24/2019   ID:  Tiffany LagosNyshekia N Hickman, DOB Nov 17, 1980, MRN [id number]  Patient Location: Home Provider Location: Home  [facility]  [facility]  Cardiologist:  New Electrophysiologist:  None   Evaluation Performed:  New Patient Evaluation  Chief Complaint:  Shortness of breath  History of Present Illness:    Tiffany Lagosyshekia N Person is a 38 y.o. female with recently diagnosed mitral stenosis, hypertension, prediabetes, and stroke (patient unaware of this).  We are speaking today for evaluation of shortness of breath following ED visit in May.  We are asked to arrange for close follow-up and echocardiogram after the patient presented with symptoms of heart [facility].  The patient did not present for follow-up visit.  Subsequent echocardiogram earlier this month showed normal LVEF but moderate to severe mitral stenosis and mild to moderate mitral regurgitation.  Today, Tiffany Hickman reports that she is feeling well.  However, she continues to have episodic shortness of breath, often accompanied by palpitations.  This can happen at any time, including at  night.  She has attributed this to asthma in the past but notes that albuterol MDI provides only minimal relief.  She had two episodes of marked shortness of breath that led to ED visit this spring, the second of which in may led to the diagnosis of heart failure.  She was discharged with furosemide, which she continues to use intermittently, and reports that this has helped her shortness of breath quite a bit.  She notes occasional leg edema, which is also controlled with furosemide.  She notes occasional tightness in her chest when she becomes particularly short of breath.  She denies lightheadedness, orthopnea, and PND.  Overall, episodes of palpitations, shortness of breath, and chest tightness have decreased in frequency since May, with only one episode since then.  Leading up to that ED visit, she was having multiple episodes per week.  Tiffany Hickman denies a history of prior cardiac disease.  She believes that she was hospitalized as a child with meningitis and may have had some sort of seizure disorder in adolescence.  She denies being told that she had rheumatic fever, though a heart murmur has been mentioned to her in the past.  She reports that her blood pressure has been poorly controlled and at times exceeds 200 mmHg systolic.  The patient does not have symptoms concerning for COVID-19 infection (fever, chills, cough, or new shortness of breath).    Past Medical History:  Diagnosis Date  . Abscess, peritonsillar   . Asthma   . Back pain   . Cubital tunnel syndrome   . DOE (dyspnea on exertion)   . Hidradenitis suppurativa   .  Hypertension   . Hypertension   . Patellar tendinitis   . Prediabetes   . Stroke ([place])   . Unspecified disorder of binocular vision    Past Surgical History:  Procedure Laterality Date  . SKIN GRAFT     LEFT FOOT     Current Meds  Medication Sig  . albuterol (PROVENTIL HFA;VENTOLIN HFA) 108 (90 Base) MCG/ACT inhaler Inhale 2 puffs into the lungs every 6  (six) hours as needed for wheezing or shortness of breath.  . cyclobenzaprine (FLEXERIL) 5 MG tablet Take 1 tablet (5 mg total) by mouth 3 (three) times daily as needed for muscle spasms.  . hydrochlorothiazide (HYDRODIURIL) 25 MG tablet Take 25 mg by mouth daily.  . magic mouthwash SOLN Take 15 mLs by mouth 4 (four) times daily as needed for mouth pain.  . metFORMIN (GLUCOPHAGE) 500 MG tablet Take 500 mg by mouth daily with breakfast.  . [DISCONTINUED] furosemide (LASIX) 20 MG tablet Take 1 tablet (20 mg total) by mouth daily for 20 days.  . [DISCONTINUED] methylPREDNISolone (MEDROL DOSEPAK) 4 MG TBPK tablet Take Tapered dose as directed     Allergies:   Patient has no known allergies.   Social History   Tobacco Use  . Smoking status: Former Smoker    Years: 1.00  . Smokeless tobacco: Never Used  Substance Use Topics  . Alcohol use: Not Currently  . Drug use: No     Family Hx: The patient's family history includes COPD in her father; Diabetes in her mother; Hypertension in her father and mother.  ROS:   Please see the history of present illness.    All other systems reviewed and are negative.   Prior CV studies:   The following studies were reviewed today:  Echocardiogram (02/11/2019): Normal LV size with mild LVH.  LVEF 55-60%.  Normal RV size and function.  Rheumatic appearing mitral valve with moderate to severe stenosis and mild to moderate regurgitation.  Mean gradient 14 mmHg.  Labs/Other Tests and Data Reviewed:    EKG:  An ECG dated 02/24/16 was personally reviewed today and demonstrated:  sinus tachycardia with biatrial enlargement, rightward axis, and non-specific ST segment abnormality.  There is no tracing available from ED visit in 11/2018 that prompted today's referral.  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  It is possible that she has underlying rheumatic heart disease and that episodes are precipitated by fluid retention and tachycardia (particularly if she has PAF or other paroxysmal tachyarrhythmia).  We have discussed the importance of heart rate control and volume management.  We have agreed to begin metoprolol succinate 25 mg daily and continue with furosemide 20 mg daily as needed for edema/weight gain.  I advised Ms. Jiles Harold to minimize  her salt intake.  We have discussed placement of an ambulatory cardiac monitor but have agreed to defer this until she follows up with us in ~1 month.  Hypertension: BP suboptimally controlled; will start metoprolol succinate 25 mg daily and continue HCTZ 25 mg daily.2  COVID-19 Education: The signs and symptoms of COVID-19 were discussed with the patient and how to seek care for testing (follow up with PCP or arrange E-visit).  The importance of social distancing was discussed today.  Time:   Today, I have spent 30 minutes with the patient with telehealth technology discussing the above problems.     Medication Adjustments/Labs and Tests Ordered: Current medicines are reviewed at length with the patient today.  Concerns regarding medicines are outlined above.   Tests Ordered: No orders of the defined types were placed in this encounter.   Medication Changes: Start metoprolol succinate 25 mg daily.  Follow Up:  In Person in 1 month(s)  Signed, Yvonne Kendallhristopher Haston Casebolt, MD   02/24/2019 7:45 PM     [facility]

## 2019-02-24 ENCOUNTER — Encounter: Payer: Self-pay | Admitting: Internal Medicine

## 2019-02-24 ENCOUNTER — Other Ambulatory Visit: Payer: Self-pay

## 2019-02-24 ENCOUNTER — Telehealth (INDEPENDENT_AMBULATORY_CARE_PROVIDER_SITE_OTHER): Payer: Medicaid Other | Admitting: Internal Medicine

## 2019-02-24 ENCOUNTER — Telehealth: Payer: Self-pay | Admitting: *Deleted

## 2019-02-24 VITALS — BP 140/90 | HR 92 | Ht 66.0 in | Wt 268.0 lb

## 2019-02-24 DIAGNOSIS — I10 Essential (primary) hypertension: Secondary | ICD-10-CM | POA: Insufficient documentation

## 2019-02-24 DIAGNOSIS — I5032 Chronic diastolic (congestive) heart failure: Secondary | ICD-10-CM | POA: Insufficient documentation

## 2019-02-24 DIAGNOSIS — I110 Hypertensive heart disease with heart failure: Secondary | ICD-10-CM

## 2019-02-24 DIAGNOSIS — I050 Rheumatic mitral stenosis: Secondary | ICD-10-CM | POA: Insufficient documentation

## 2019-02-24 DIAGNOSIS — I059 Rheumatic mitral valve disease, unspecified: Secondary | ICD-10-CM

## 2019-02-24 MED ORDER — FUROSEMIDE 20 MG PO TABS: 20.0000 mg | ORAL_TABLET | Freq: Every day | ORAL | 5 refills | Status: DC | PRN

## 2019-02-24 MED ORDER — METOPROLOL SUCCINATE ER 25 MG PO TB24: 25.0000 mg | ORAL_TABLET | Freq: Every day | ORAL | 2 refills | Status: DC

## 2019-02-24 NOTE — Patient Instructions (Signed)
Medication Instructions:  Your physician has recommended you make the following change in your medication:  1- START Metoprolol succinate 25 mg by mouth once a day. 2- CHANGE Furosemide (Lasix) to 20 mg by mouth once a day AS NEEDED for swelling/edema and/or shortness of breath.  If you need a refill on your cardiac medications before your next appointment, please call your pharmacy.   Lab work: - None ordered.  If you have labs (blood work) drawn today and your tests are completely normal, you will receive your results only by: Marland Kitchen MyChart Message (if you have MyChart) OR . A paper copy in the mail If you have any lab test that is abnormal or we need to change your treatment, we will call you to review the results.  Testing/Procedures: - None ordered.   Follow-Up: At [facility], you and your health needs are our priority.  As part of our continuing mission to provide you with exceptional heart care, we have created designated Provider Care Teams.  These Care Teams include your primary Cardiologist (physician) and Advanced Practice Providers (APPs -  Physician Assistants and Nurse Practitioners) who all work together to provide you with the care you need, when you need it. You will need a follow up appointment in 1 months with Dr End or APP.  You may see DR Harrell Gave END or one of the following Advanced Practice Providers on your designated Care Team:   Murray Hodgkins, NP Christell Faith, PA-C . Marrianne Mood, PA-C

## 2019-02-24 NOTE — Telephone Encounter (Signed)
Called patient and went over AVS from virtual visit today with Dr End. She verbalized understanding of the med instructions from below: 1- START Metoprolol succinate 25 mg by mouth once a day. 2- CHANGE Furosemide (Lasix) to 20 mg by mouth once a day AS NEEDED for swelling/edema and/or shortness of breath.  I went ahead and scheduled her for follow up in 1 month with Dr End.  AVS to be mailed to patient. Rx's sent to pharmacy.

## 2019-03-28 NOTE — Progress Notes (Deleted)
 Follow-up Outpatient Visit Date: 03/30/2019  Primary Care Provider: Center, [facility] [address] [place] [address]  Chief Complaint: ***  HPI:  Tiffany Hickman is a 38 y.o. year-old female with history of recently diagnosed mitral stenosis with HFpEF, hypertension, prediabetes, and stroke (patient unaware of this diagnosis), who presents for follow-up of HFpEF and mitral stenosis.  We met during a virtual visit in late August, at which time Ms. Hickman reported feeling better after her ED visit in May when she was first diagnosed with heart failure.  She continued to note occasional leg edema that was typically controlled with furosemide.  We agreed to switch furosemide to as needed and start metoprolol succinate 25 mg daily.  --------------------------------------------------------------------------------------------------  Cardiovascular History & Procedures: Cardiovascular Problems:  Mitral stenosis and HFpEF  Risk Factors:  Hypertension, prior stroke, prediabetes, and obesity  Cath/PCI:  None  CV Surgery:  None  EP Procedures and Devices:  None  Non-Invasive Evaluation(s):  TTE (02/11/2019): Normal LV size with mild LVH.  LVEF 55-60%.  Normal RV size and function.  Mild left atrial enlargement.  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  Mean gradient 14 mmHg.  Valve area 1.8 to 2 cm.  Mild to moderate mitral regurgitation.  Recent CV Pertinent Labs: Lab Results  Component Value Date   BNP 181.0 (H) 11/16/2018   K 4.0 11/16/2018   K 3.7 12/29/2011   BUN 8 11/16/2018   BUN 7 12/29/2011   CREATININE 0.63 11/16/2018   CREATININE 0.66 12/29/2011    Recent CV Pertinent Labs: Lab Results  Component Value Date   BNP 181.0 (H) 11/16/2018   K 4.0 11/16/2018   K 3.7 12/29/2011   BUN 8 11/16/2018   BUN 7 12/29/2011   CREATININE 0.63 11/16/2018   CREATININE 0.66 12/29/2011    Past medical and surgical history were  reviewed and updated in EPIC.  No outpatient medications have been marked as taking for the 03/30/19 encounter (Appointment) with Hickman, Christopher, MD.    Allergies: Patient has no known allergies.  Social History   Tobacco Use  . Smoking status: Former Smoker    Years: 1.00  . Smokeless tobacco: Never Used  Substance Use Topics  . Alcohol use: Not Currently  . Drug use: No    Family History  Problem Relation Age of Onset  . Diabetes Mother   . Hypertension Mother   . Hypertension Father   . COPD Father     Review of Systems: A 12-system review of systems was performed and was negative except as noted in the HPI.  --------------------------------------------------------------------------------------------------  Physical Exam: There were no vitals taken for this visit.  General:  *** HEENT: No conjunctival pallor or scleral icterus. Moist mucous membranes.  OP clear. Neck: Supple without lymphadenopathy, thyromegaly, JVD, or HJR. No carotid bruit. Lungs: Normal work of breathing. Clear to auscultation bilaterally without wheezes or crackles. Heart: Regular rate and rhythm without murmurs, rubs, or gallops. Non-displaced PMI. Abd: Bowel sounds present. Soft, NT/ND without hepatosplenomegaly Ext: No lower extremity edema. Radial, PT, and DP pulses are 2+ bilaterally. Skin: Warm and dry without rash.  EKG:  ***  Lab Results  Component Value Date   WBC 16.1 (H) 11/16/2018   HGB 11.4 (L) 11/16/2018   HCT 35.8 (L) 11/16/2018   MCV 85.0 11/16/2018   PLT 311 11/16/2018    Lab Results  Component Value Date   NA 140 11/16/2018   K 4.0 11/16/2018     CL 105 11/16/2018   CO2 25 11/16/2018   BUN 8 11/16/2018   CREATININE 0.63 11/16/2018   GLUCOSE 119 (H) 11/16/2018   ALT 14 11/16/2018    No results found for: CHOL, HDL, LDLCALC, LDLDIRECT, TRIG, CHOLHDL  --------------------------------------------------------------------------------------------------  ASSESSMENT  AND PLAN: ***  Tiffany Bush, MD 03/28/2019 8:44 AM

## 2019-03-30 ENCOUNTER — Ambulatory Visit: Payer: Medicaid Other | Admitting: Internal Medicine

## 2019-06-20 ENCOUNTER — Other Ambulatory Visit: Payer: Self-pay

## 2019-06-20 DIAGNOSIS — Z20822 Contact with and (suspected) exposure to covid-19: Secondary | ICD-10-CM

## 2019-06-21 LAB — NOVEL CORONAVIRUS, NAA: SARS-CoV-2, NAA: NOT DETECTED

## 2019-06-26 ENCOUNTER — Telehealth: Payer: Self-pay | Admitting: Hematology

## 2019-06-26 NOTE — Telephone Encounter (Signed)
Pt is aware covid 19 test is neg on 06/26/2019 

## 2019-09-29 ENCOUNTER — Emergency Department: Payer: Medicaid Other

## 2019-09-29 ENCOUNTER — Other Ambulatory Visit: Payer: Self-pay

## 2019-09-29 ENCOUNTER — Emergency Department
Admission: EM | Admit: 2019-09-29 | Discharge: 2019-09-29 | Disposition: A | Discharge status: Home / Self Care | Payer: Medicaid Other | Attending: Emergency Medicine | Admitting: Emergency Medicine

## 2019-09-29 DIAGNOSIS — M25512 Pain in left shoulder: Secondary | ICD-10-CM | POA: Insufficient documentation

## 2019-09-29 DIAGNOSIS — R0789 Other chest pain: Secondary | ICD-10-CM | POA: Diagnosis not present

## 2019-09-29 DIAGNOSIS — J189 Pneumonia, unspecified organism: Secondary | ICD-10-CM | POA: Diagnosis not present

## 2019-09-29 DIAGNOSIS — I10 Essential (primary) hypertension: Secondary | ICD-10-CM | POA: Insufficient documentation

## 2019-09-29 DIAGNOSIS — Z79899 Other long term (current) drug therapy: Secondary | ICD-10-CM | POA: Diagnosis not present

## 2019-09-29 DIAGNOSIS — M542 Cervicalgia: Secondary | ICD-10-CM | POA: Diagnosis present

## 2019-09-29 LAB — URINALYSIS, COMPLETE (UACMP) WITH MICROSCOPIC
Bacteria, UA: NONE SEEN
Bilirubin Urine: NEGATIVE
Glucose, UA: NEGATIVE mg/dL
Hgb urine dipstick: NEGATIVE
Ketones, ur: NEGATIVE mg/dL
Leukocytes,Ua: NEGATIVE
Nitrite: NEGATIVE
Protein, ur: 30 mg/dL — AB
Specific Gravity, Urine: >1.03 — ABNORMAL HIGH (ref 1.005–1.030)
pH: 6 (ref 5.0–8.0)

## 2019-09-29 LAB — COMPREHENSIVE METABOLIC PANEL
ALT: 18 U/L (ref 0–44)
AST: 20 U/L (ref 15–41)
Albumin: 3.6 g/dL (ref 3.5–5.0)
Alkaline Phosphatase: 83 U/L (ref 38–126)
Anion gap: 7 (ref 5–15)
BUN: 9 mg/dL (ref 6–20)
CO2: 25 mmol/L (ref 22–32)
Calcium: 8.8 mg/dL — ABNORMAL LOW (ref 8.9–10.3)
Chloride: 106 mmol/L (ref 98–111)
Creatinine, Ser: 0.67 mg/dL (ref 0.44–1.00)
GFR calc Af Amer: >60 mL/min (ref >60)
GFR calc non Af Amer: >60 mL/min (ref >60)
Glucose, Bld: 98 mg/dL (ref 70–99)
Potassium: 3.8 mmol/L (ref 3.5–5.1)
Sodium: 138 mmol/L (ref 135–145)
Total Bilirubin: 0.7 mg/dL (ref 0.3–1.2)
Total Protein: 7.3 g/dL (ref 6.5–8.1)

## 2019-09-29 LAB — CBC WITH DIFFERENTIAL/PLATELET
Abs Immature Granulocytes: 0.05 10*3/uL (ref 0.00–0.07)
Basophils Absolute: 0.1 10*3/uL (ref 0.0–0.1)
Basophils Relative: 1 %
Eosinophils Absolute: 0.3 10*3/uL (ref 0.0–0.5)
Eosinophils Relative: 3 %
HCT: 35.5 % — ABNORMAL LOW (ref 36.0–46.0)
Hemoglobin: 11.5 g/dL — ABNORMAL LOW (ref 12.0–15.0)
Immature Granulocytes: 0 %
Lymphocytes Relative: 13 %
Lymphs Abs: 1.5 10*3/uL (ref 0.7–4.0)
MCH: 27.2 pg (ref 26.0–34.0)
MCHC: 32.4 g/dL (ref 30.0–36.0)
MCV: 83.9 fL (ref 80.0–100.0)
Monocytes Absolute: 0.8 10*3/uL (ref 0.1–1.0)
Monocytes Relative: 7 %
Neutro Abs: 8.7 10*3/uL — ABNORMAL HIGH (ref 1.7–7.7)
Neutrophils Relative %: 76 %
Platelets: 302 10*3/uL (ref 150–400)
RBC: 4.23 MIL/uL (ref 3.87–5.11)
RDW: 15.2 % (ref 11.5–15.5)
WBC: 11.4 10*3/uL — ABNORMAL HIGH (ref 4.0–10.5)
nRBC: 0 % (ref 0.0–0.2)

## 2019-09-29 LAB — PREGNANCY, URINE: Preg Test, Ur: NEGATIVE

## 2019-09-29 LAB — TROPONIN I (HIGH SENSITIVITY): Troponin I (High Sensitivity): 5 ng/L (ref <18)

## 2019-09-29 LAB — FIBRIN DERIVATIVES D-DIMER (ARMC ONLY): Fibrin derivatives D-dimer (ARMC): 693.56 ng/mL (FEU) — ABNORMAL HIGH (ref 0.00–499.00)

## 2019-09-29 MED ORDER — IOHEXOL 350 MG/ML SOLN
75.0000 mL | Freq: Once | INTRAVENOUS | Status: AC | PRN
  Administered 2019-09-29: 75 mL via INTRAVENOUS
  Filled 2019-09-29: qty 75

## 2019-09-29 MED ORDER — DOXYCYCLINE HYCLATE 100 MG PO CAPS: 100.0000 mg | ORAL_CAPSULE | Freq: Two times a day (BID) | ORAL | 0 refills | Status: DC

## 2019-09-29 MED ORDER — ACETAMINOPHEN 500 MG PO TABS
1000.0000 mg | ORAL_TABLET | Freq: Once | ORAL | Status: AC
  Administered 2019-09-29: 1000 mg via ORAL
  Filled 2019-09-29: qty 2

## 2019-09-29 MED ORDER — TRAMADOL HCL 50 MG PO TABS: 50.0000 mg | ORAL_TABLET | Freq: Four times a day (QID) | ORAL | 0 refills | Status: DC | PRN

## 2019-09-29 NOTE — ED Notes (Signed)
See triage note  Presents with neck pain which started yesterday  States she has had left shoulder pain for 2 weeks prior  Denies any injury

## 2019-09-29 NOTE — ED Provider Notes (Signed)
Lurline Idol, attending physician, personally viewed and interpreted this EKG  EKG Time: 1359 Rate: 95 Rhythm: normal sinus rhythm Axis: normal Intervals: qtc 469 QRS: narrow ST changes: no st elevation Impression: biatrial enlargement, abnormal ekg    Phineas Semen, MD 09/29/19 1404

## 2019-09-29 NOTE — ED Provider Notes (Signed)
[facility] Emergency Department Provider Note  ____________________________________________   First MD Initiated Contact with Patient 09/29/19 1259     (approximate)  I have reviewed the triage vital signs and the nursing notes.   HISTORY  Chief Complaint Neck Pain   HPI Tiffany Hickman is a 39 y.o. female presents to the ED with complaint of neck pain radiating down into her left shoulder for the last 2 weeks.  She states that last evening she experienced some left sided chest pain that radiated up into her neck.  She denies any nausea, vomiting, indigestion, difficulty breathing or dizziness.  Patient denies any injury to her neck or arm.  Patient reports she does have a history of hypertension and "prediabetes".  She denies any previous cardiac history.  She rates her pain as 9 out of 10.      Past Medical History:  Diagnosis Date  . Abscess, peritonsillar   . Asthma   . Back pain   . Cubital tunnel syndrome   . DOE (dyspnea on exertion)   . Hidradenitis suppurativa   . Hypertension   . Hypertension   . Patellar tendinitis   . Prediabetes   . Stroke (HCC)   . Unspecified disorder of binocular vision     Patient Active Problem List   Diagnosis Date Noted  . Chronic heart failure with preserved ejection fraction (HFpEF) (HCC) 02/24/2019  . Mitral valve disease 02/24/2019  . Essential hypertension 02/24/2019    Past Surgical History:  Procedure Laterality Date  . SKIN GRAFT     LEFT FOOT    Prior to Admission medications   Medication Sig Start Date End Date Taking? Authorizing Provider  albuterol (PROVENTIL HFA;VENTOLIN HFA) 108 (90 Base) MCG/ACT inhaler Inhale 2 puffs into the lungs every 6 (six) hours as needed for wheezing or shortness of breath. 09/15/18   Joni Reining, PA-C  doxycycline (VIBRAMYCIN) 100 MG capsule Take 1 capsule (100 mg total) by mouth 2 (two) times daily. 09/29/19   Tommi Rumps, PA-C  furosemide (LASIX)  20 MG tablet Take 1 tablet (20 mg total) by mouth daily as needed for up to 20 days for edema (and shortness of breath.). 02/24/19 03/16/19  End, Cristal Deer, MD  hydrochlorothiazide (HYDRODIURIL) 25 MG tablet Take 25 mg by mouth daily.    [provider]  metFORMIN (GLUCOPHAGE) 500 MG tablet Take 500 mg by mouth daily with breakfast.    [provider]  metoprolol succinate (TOPROL XL) 25 MG 24 hr tablet Take 1 tablet (25 mg total) by mouth daily. 02/24/19   End, Cristal Deer, MD  traMADol (ULTRAM) 50 MG tablet Take 1 tablet (50 mg total) by mouth every 6 (six) hours as needed. 09/29/19   Tommi Rumps, PA-C    Allergies Patient has no known allergies.  Family History  Problem Relation Age of Onset  . Diabetes Mother   . Hypertension Mother   . Hypertension Father   . COPD Father     Social History Social History   Tobacco Use  . Smoking status: Former Smoker    Years: 1.00  . Smokeless tobacco: Never Used  Substance Use Topics  . Alcohol use: Not Currently  . Drug use: No    Review of Systems Constitutional: No fever/chills Eyes: No visual changes. ENT: No sore throat. Cardiovascular: Positive intermittent anterior chest wall pain left side. Respiratory: Denies shortness of breath.  Negative for cough.   Gastrointestinal: No abdominal pain.  No nausea, no vomiting.  No diarrhea.  No constipation. Genitourinary: Negative for dysuria. Musculoskeletal: Positive for left arm pain with radiation into the left side of the neck. Skin: Negative for rash. Neurological: Negative for headaches, focal weakness or numbness. ____________________________________________   PHYSICAL EXAM:  VITAL SIGNS: ED Triage Vitals  Enc Vitals Group     BP 09/29/19 1239 (!) 174/100     Pulse Rate 09/29/19 1239 100     Resp 09/29/19 1239 18     Temp 09/29/19 1239 98 F (36.7 C)     Temp Source 09/29/19 1239 Oral     SpO2 09/29/19 1239 100 %     Weight 09/29/19 1238 265 lb  (120.2 kg)     Height 09/29/19 1238 5\' 6"  (1.676 m)     Head Circumference --      Peak Flow --      Pain Score 09/29/19 1237 9     Pain Loc --      Pain Edu? --      Excl. in GC? --    Constitutional: Alert and oriented. Well appearing and in no acute distress. Eyes: Conjunctivae are normal.  Head: Atraumatic. Nose: No congestion/rhinnorhea. Mouth/Throat: Mucous membranes are moist.  Oropharynx non-erythematous. Neck: No stridor.   Cardiovascular: Normal rate, regular rhythm. Grossly normal heart sounds.  Good peripheral circulation. Respiratory: Normal respiratory effort.  No retractions. Lungs CTAB. Gastrointestinal: Soft and nontender. No distention.  No CVA tenderness. Musculoskeletal: Moves upper and lower extremities any difficulty.  Normal gait was noted. Neurologic:  Normal speech and language. No gross focal neurologic deficits are appreciated. No gait instability. Skin:  Skin is warm, dry and intact. No rash noted. Psychiatric: Mood and affect are normal. Speech and behavior are normal.  ____________________________________________   LABS (all labs ordered are listed, but only abnormal results are displayed)  Labs Reviewed  CBC WITH DIFFERENTIAL/PLATELET - Abnormal; Notable for the following components:      Result Value   WBC 11.4 (*)    Hemoglobin 11.5 (*)    HCT 35.5 (*)    Neutro Abs 8.7 (*)    All other components within normal limits  COMPREHENSIVE METABOLIC PANEL - Abnormal; Notable for the following components:   Calcium 8.8 (*)    All other components within normal limits  URINALYSIS, COMPLETE (UACMP) WITH MICROSCOPIC - Abnormal; Notable for the following components:   Specific Gravity, Urine >1.030 (*)    Protein, ur 30 (*)    All other components within normal limits  FIBRIN DERIVATIVES D-DIMER ([facility] ONLY) - Abnormal; Notable for the following components:   Fibrin derivatives D-dimer [facility]) 693.56 (*)    All other components within normal limits    PREGNANCY, URINE  TROPONIN I (HIGH SENSITIVITY)   ____________________________________________  EKG  EKG was read by Dr. [facility] with normal sinus rhythm and a ventricular rate of 95. ____________________________________________  RADIOLOGY  Official radiology report(s): CT Angio Chest PE W and/or Wo Contrast  Result Date: 09/29/2019 CLINICAL DATA:  Shortness of breath EXAM: CT ANGIOGRAPHY CHEST WITH CONTRAST TECHNIQUE: Multidetector CT imaging of the chest was performed using the standard protocol during bolus administration of intravenous contrast. Multiplanar CT image reconstructions and MIPs were obtained to evaluate the vascular anatomy. CONTRAST:  6mL OMNIPAQUE IOHEXOL 350 MG/ML SOLN COMPARISON:  None. FINDINGS: Cardiovascular: Satisfactory opacification of the pulmonary arteries to the proximal segmental level. No evidence of pulmonary embolism. Normal heart size. No pericardial effusion. Mediastinum/Nodes: There are no enlarged mediastinal  or hilar lymph nodes. No axillary adenopathy. Lungs/Pleura: Atelectasis/consolidation within the anterior basal left lower lobe. No mass. Apical ground-glass nodules on the prior study are no longer identified. No pleural effusion or pneumothorax. Upper Abdomen: No acute abnormality. Musculoskeletal: Upper thoracic spine facet hypertrophy. No acute osseous abnormality. Review of the MIP images confirms the above findings. IMPRESSION: No evidence of acute pulmonary embolism. Left anterior basal lower lobe atelectasis/consolidation. Electronically Signed   By: Macy Mis M.D.   On: 09/29/2019 16:16    ____________________________________________   PROCEDURES  Procedure(s) performed (including Critical Care):  Procedures   ____________________________________________   INITIAL IMPRESSION / ASSESSMENT AND PLAN / ED COURSE  As part of my medical decision making, I reviewed the following data within the electronic MEDICAL RECORD NUMBER Notes  from prior ED visits and [place] Controlled Substance Database    GAZELLE TOWE was evaluated in Emergency Department on 09/30/2019 for the symptoms described in the history of present illness. She was evaluated in the context of the global COVID-19 pandemic, which necessitated consideration that the patient might be at risk for infection with the SARS-CoV-2 virus that causes COVID-19. Institutional protocols and algorithms that pertain to the evaluation of patients at risk for COVID-19 are in a state of rapid change based on information released by regulatory bodies including the CDC and federal and state organizations. These policies and algorithms were followed during the patient's care in the ED.  39 year old female presents to ED with neck pain and left shoulder pain without history of injury.  Shoulder pain for 2 weeks and neck pain that radiates to left  upper anterior chest area.  No prior cardiac history.  Patient has a history of hypertension and prediabetes.  Physical exam was unremarkable but lab work results were questionable for PE.  D-Dimer was elevated 693.56 and WBC 11.4.  CTA shows a questionable left anterior basal lower lobe atelectasis/consolidation. With patients symptoms most likely this is a pneumonia and patient was treated with doxycycline.  She is aware that she will need to return immediately if any severe worsening of her shortness of breath or difficulty breathing.  She will take Tramadol if needed for pain.            ____________________________________________   FINAL CLINICAL IMPRESSION(S) / ED DIAGNOSES  Final diagnoses:  Pneumonia of left lower lobe due to infectious organism     ED Discharge Orders         Ordered    doxycycline (VIBRAMYCIN) 100 MG capsule  2 times daily     09/29/19 1634    traMADol (ULTRAM) 50 MG tablet  Every 6 hours PRN     09/29/19 1635           Note:  This document was prepared using Dragon voice recognition software and  may include unintentional dictation errors.    Johnn Hai, PA-C 09/30/19 1418    Duffy Bruce, MD 10/01/19 [phone]

## 2019-09-29 NOTE — ED Triage Notes (Signed)
Pt c/o left neck pain radiating into the arm for the past 2 weeks. Denies injury.

## 2019-09-29 NOTE — Discharge Instructions (Signed)
Call make an appointment for a follow-up visit with your primary care provider.  Begin taking antibiotics as directed until completely finished.  Doxycycline is twice a day every day for the next 10 days.  Also tramadol was sent to your pharmacy if needed for pain.  You may also take Tylenol or ibuprofen with this medication if needed.  Be aware that the tramadol could cause drowsiness and do not drive or operate machinery while taking it.  Increase fluids.  Return to the emergency department if any sudden shortness of breath or worsening of your symptoms.  Also your blood pressure was elevated initially in triage and your primary care provider should recheck your blood pressure.  Continue taking your regular medications as prescribed by your doctor.

## 2020-01-27 ENCOUNTER — Emergency Department: Payer: Medicaid Other

## 2020-01-27 ENCOUNTER — Other Ambulatory Visit: Payer: Self-pay

## 2020-01-27 ENCOUNTER — Encounter: Payer: Self-pay | Admitting: Emergency Medicine

## 2020-01-27 ENCOUNTER — Emergency Department
Admission: EM | Admit: 2020-01-27 | Discharge: 2020-01-27 | Disposition: A | Discharge status: Home / Self Care | Payer: Medicaid Other | Attending: Emergency Medicine | Admitting: Emergency Medicine

## 2020-01-27 DIAGNOSIS — J45901 Unspecified asthma with (acute) exacerbation: Secondary | ICD-10-CM

## 2020-01-27 DIAGNOSIS — Z87891 Personal history of nicotine dependence: Secondary | ICD-10-CM | POA: Insufficient documentation

## 2020-01-27 DIAGNOSIS — I5032 Chronic diastolic (congestive) heart failure: Secondary | ICD-10-CM | POA: Insufficient documentation

## 2020-01-27 DIAGNOSIS — I110 Hypertensive heart disease with heart failure: Secondary | ICD-10-CM | POA: Diagnosis not present

## 2020-01-27 DIAGNOSIS — R05 Cough: Secondary | ICD-10-CM | POA: Insufficient documentation

## 2020-01-27 DIAGNOSIS — R0600 Dyspnea, unspecified: Secondary | ICD-10-CM

## 2020-01-27 DIAGNOSIS — R059 Cough, unspecified: Secondary | ICD-10-CM

## 2020-01-27 DIAGNOSIS — R0789 Other chest pain: Secondary | ICD-10-CM | POA: Insufficient documentation

## 2020-01-27 DIAGNOSIS — J45909 Unspecified asthma, uncomplicated: Secondary | ICD-10-CM | POA: Insufficient documentation

## 2020-01-27 DIAGNOSIS — Z20822 Contact with and (suspected) exposure to covid-19: Secondary | ICD-10-CM | POA: Diagnosis not present

## 2020-01-27 DIAGNOSIS — R0602 Shortness of breath: Secondary | ICD-10-CM | POA: Insufficient documentation

## 2020-01-27 DIAGNOSIS — Z7984 Long term (current) use of oral hypoglycemic drugs: Secondary | ICD-10-CM | POA: Insufficient documentation

## 2020-01-27 DIAGNOSIS — Z79899 Other long term (current) drug therapy: Secondary | ICD-10-CM | POA: Diagnosis not present

## 2020-01-27 DIAGNOSIS — Z8673 Personal history of transient ischemic attack (TIA), and cerebral infarction without residual deficits: Secondary | ICD-10-CM | POA: Diagnosis not present

## 2020-01-27 LAB — CBC
HCT: 37.2 % (ref 36.0–46.0)
Hemoglobin: 12.5 g/dL (ref 12.0–15.0)
MCH: 28.2 pg (ref 26.0–34.0)
MCHC: 33.6 g/dL (ref 30.0–36.0)
MCV: 83.8 fL (ref 80.0–100.0)
Platelets: 294 10*3/uL (ref 150–400)
RBC: 4.44 MIL/uL (ref 3.87–5.11)
RDW: 15 % (ref 11.5–15.5)
WBC: 12.5 10*3/uL — ABNORMAL HIGH (ref 4.0–10.5)
nRBC: 0 % (ref 0.0–0.2)

## 2020-01-27 LAB — TROPONIN I (HIGH SENSITIVITY)
Troponin I (High Sensitivity): 7 ng/L (ref <18)
Troponin I (High Sensitivity): 6 ng/L (ref <18)

## 2020-01-27 LAB — BASIC METABOLIC PANEL
Anion gap: 11 (ref 5–15)
BUN: 11 mg/dL (ref 6–20)
CO2: 23 mmol/L (ref 22–32)
Calcium: 9 mg/dL (ref 8.9–10.3)
Chloride: 104 mmol/L (ref 98–111)
Creatinine, Ser: 0.94 mg/dL (ref 0.44–1.00)
GFR calc Af Amer: >60 mL/min (ref >60)
GFR calc non Af Amer: >60 mL/min (ref >60)
Glucose, Bld: 110 mg/dL — ABNORMAL HIGH (ref 70–99)
Potassium: 3.5 mmol/L (ref 3.5–5.1)
Sodium: 138 mmol/L (ref 135–145)

## 2020-01-27 LAB — POCT PREGNANCY, URINE: Preg Test, Ur: NEGATIVE

## 2020-01-27 LAB — SARS CORONAVIRUS 2 BY RT PCR (HOSPITAL ORDER, PERFORMED IN ~~LOC~~ HOSPITAL LAB): SARS Coronavirus 2: NEGATIVE

## 2020-01-27 LAB — FIBRIN DERIVATIVES D-DIMER (ARMC ONLY): Fibrin derivatives D-dimer (ARMC): 877.94 ng/mL (FEU) — ABNORMAL HIGH (ref 0.00–499.00)

## 2020-01-27 MED ORDER — AZITHROMYCIN 250 MG PO TABS
ORAL_TABLET | ORAL | 0 refills | Status: DC
Start: 2020-01-27 — End: 2023-08-30

## 2020-01-27 MED ORDER — ALBUTEROL SULFATE HFA 108 (90 BASE) MCG/ACT IN AERS
2.0000 | INHALATION_SPRAY | RESPIRATORY_TRACT | 0 refills | Status: DC | PRN
Start: 2020-01-27 — End: 2022-05-25

## 2020-01-27 MED ORDER — IPRATROPIUM-ALBUTEROL 0.5-2.5 (3) MG/3ML IN SOLN
3.0000 mL | Freq: Once | RESPIRATORY_TRACT | Status: AC
  Administered 2020-01-27: 3 mL via RESPIRATORY_TRACT
  Filled 2020-01-27: qty 3

## 2020-01-27 MED ORDER — SODIUM CHLORIDE 0.9% FLUSH: 3.0000 mL | Freq: Once | INTRAVENOUS | Status: DC

## 2020-01-27 MED ORDER — PREDNISONE 20 MG PO TABS: 40.0000 mg | ORAL_TABLET | Freq: Every day | ORAL | 0 refills | Status: DC

## 2020-01-27 MED ORDER — HYDROCOD POLST-CPM POLST ER 10-8 MG/5ML PO SUER
5.0000 mL | Freq: Once | ORAL | Status: AC
  Administered 2020-01-27: 5 mL via ORAL
  Filled 2020-01-27: qty 5

## 2020-01-27 MED ORDER — METHYLPREDNISOLONE SODIUM SUCC 125 MG IJ SOLR
125.0000 mg | Freq: Once | INTRAMUSCULAR | Status: AC
  Administered 2020-01-27: 125 mg via INTRAVENOUS
  Filled 2020-01-27: qty 2

## 2020-01-27 MED ORDER — GUAIFENESIN 100 MG/5ML PO SOLN
5.0000 mL | ORAL | 0 refills | Status: DC | PRN
Start: 2020-01-27 — End: 2023-08-30

## 2020-01-27 MED ORDER — IOHEXOL 350 MG/ML SOLN
75.0000 mL | Freq: Once | INTRAVENOUS | Status: AC | PRN
  Administered 2020-01-27: 75 mL via INTRAVENOUS

## 2020-01-27 MED ORDER — LORAZEPAM 2 MG/ML IJ SOLN
1.0000 mg | Freq: Once | INTRAMUSCULAR | Status: AC
  Administered 2020-01-27: 1 mg via INTRAVENOUS
  Filled 2020-01-27: qty 1

## 2020-01-27 NOTE — ED Notes (Addendum)
Patient is resting with eyes closed, wakes easily. No cough noted at this time.

## 2020-01-27 NOTE — ED Notes (Signed)
Patient medicated prior to CT scan.

## 2020-01-27 NOTE — ED Provider Notes (Signed)
[facility] Emergency Department Provider Note  Time seen: 1:36 PM  I have reviewed the triage vital signs and the nursing notes.   HISTORY  Chief Complaint Cough and Chest Pain   HPI Tiffany Hickman is a 39 y.o. female with a past medical history of asthma, hypertension, CHF, presents to the emergency department  for shortness of breath and cough.  According to the patient yesterday she began experiencing a mild cough consistent with her asthma.  Patient states she is out of her albuterol inhaler.  States overnight into today the cough has worsened along with some shortness of breath and chest pressure.  Denies any chest "pain" denies any pleuritic pain.  No abdominal pain nausea vomiting or diarrhea.  No leg pain or swelling.  Denies any known fever.  Patient did not receive Covid vaccinations.  Past Medical History:  Diagnosis Date  . Abscess, peritonsillar   . Asthma   . Back pain   . Cubital tunnel syndrome   . DOE (dyspnea on exertion)   . Hidradenitis suppurativa   . Hypertension   . Hypertension   . Patellar tendinitis   . Prediabetes   . Stroke (HCC)   . Unspecified disorder of binocular vision     Patient Active Problem List   Diagnosis Date Noted  . Chronic heart failure with preserved ejection fraction (HFpEF) (HCC) 02/24/2019  . Mitral valve disease 02/24/2019  . Essential hypertension 02/24/2019    Past Surgical History:  Procedure Laterality Date  . SKIN GRAFT     LEFT FOOT    Prior to Admission medications   Medication Sig Start Date End Date Taking? Authorizing Provider  albuterol (PROVENTIL HFA;VENTOLIN HFA) 108 (90 Base) MCG/ACT inhaler Inhale 2 puffs into the lungs every 6 (six) hours as needed for wheezing or shortness of breath. 09/15/18   Joni Reining, PA-C  doxycycline (VIBRAMYCIN) 100 MG capsule Take 1 capsule (100 mg total) by mouth 2 (two) times daily. 09/29/19   Tommi Rumps, PA-C  furosemide (LASIX) 20 MG  tablet Take 1 tablet (20 mg total) by mouth daily as needed for up to 20 days for edema (and shortness of breath.). 02/24/19 03/16/19  End, Cristal Deer, MD  hydrochlorothiazide (HYDRODIURIL) 25 MG tablet Take 25 mg by mouth daily.    [provider]  metFORMIN (GLUCOPHAGE) 500 MG tablet Take 500 mg by mouth daily with breakfast.    [provider]  metoprolol succinate (TOPROL XL) 25 MG 24 hr tablet Take 1 tablet (25 mg total) by mouth daily. 02/24/19   End, Cristal Deer, MD  traMADol (ULTRAM) 50 MG tablet Take 1 tablet (50 mg total) by mouth every 6 (six) hours as needed. 09/29/19   Tommi Rumps, PA-C    No Known Allergies  Family History  Problem Relation Age of Onset  . Diabetes Mother   . Hypertension Mother   . Hypertension Father   . COPD Father     Social History Social History   Tobacco Use  . Smoking status: Former Smoker    Years: 1.00  . Smokeless tobacco: Never Used  Substance Use Topics  . Alcohol use: Not Currently  . Drug use: No    Review of Systems Constitutional: Negative for fever. Cardiovascular: Positive for chest tightness. Respiratory: Positive for shortness of breath.  Positive for cough. Gastrointestinal: Negative for abdominal pain, vomiting  Musculoskeletal: Negative for musculoskeletal complaints Neurological: Negative for headache All other ROS negative  ____________________________________________  PHYSICAL EXAM:  VITAL SIGNS: ED Triage Vitals  Enc Vitals Group     BP 01/27/20 1305 (!) 146/87     Pulse Rate 01/27/20 1305 (!) 114     Resp 01/27/20 1305 22     Temp 01/27/20 1305 97.8 F (36.6 C)     Temp Source 01/27/20 1305 Oral     SpO2 01/27/20 1305 99 %     Weight --      Height --      Head Circumference --      Peak Flow --      Pain Score 01/27/20 1311 2     Pain Loc --      Pain Edu? --      Excl. in GC? --     Constitutional: Alert and oriented. Well appearing and in no distress. Eyes: Normal  exam ENT      Head: Normocephalic and atraumatic.      Mouth/Throat: Mucous membranes are moist. Cardiovascular: Regular rhythm rate around 120.  No obvious murmur. Respiratory: Patient has slight expiratory wheeze.  Moderate tachypnea coughing at times. Gastrointestinal: Soft and nontender. No distention. Musculoskeletal: Nontender with normal range of motion in all extremities.  Neurologic:  Normal speech and language. No gross focal neurologic deficits  Skin:  Skin is warm, dry and intact.  Psychiatric: Mood and affect are normal. Speech and behavior are normal.   ____________________________________________    EKG  EKG viewed and interpreted by myself shows sinus tachycardia 123 bpm with a narrow QRS, left axis deviation, largely normal intervals with nonspecific ST changes.  ____________________________________________    RADIOLOGY   IMPRESSION:  1. Mild, diffuse bilateral interstitial pulmonary opacity,  consistent with edema or infection. No focal airspace opacity.   2. Mild cardiomegaly in AP portable projection.   ____________________________________________   INITIAL IMPRESSION / ASSESSMENT AND PLAN / ED COURSE  Pertinent labs & imaging results that were available during my care of the patient were reviewed by me and considered in my medical decision making (see chart for details).   Patient presents emergency department for cough and shortness of breath.  Patient has a history of asthma as well as CHF.  Patient is breathing normally at times and then has episodes of significant cough with tachypnea.  Patient does have a slight wheeze on exam.  Differential would include CHF exacerbation, pulmonary edema, pneumonia, pneumothorax, asthma exacerbation, infectious etiology.  We will check labs, Covid swab, chest x-ray.  We will dose IV Solu-Medrol, duo nebs and continue to closely monitor while awaiting results.  Patient's chest x-ray consistent with edema versus  infection.  Patient is D-dimer is slightly elevated.  We will proceed with CTA imaging to help further differentiate the patient's condition as well as rule out PE.  Patient agreeable to plan of care.  Patient care signed out to oncoming provider.  Tiffany Hickman was evaluated in Emergency Department on 01/27/2020 for the symptoms described in the history of present illness. She was evaluated in the context of the global COVID-19 pandemic, which necessitated consideration that the patient might be at risk for infection with the SARS-CoV-2 virus that causes COVID-19. Institutional protocols and algorithms that pertain to the evaluation of patients at risk for COVID-19 are in a state of rapid change based on information released by regulatory bodies including the CDC and federal and state organizations. These policies and algorithms were followed during the patient's care in the ED.  ____________________________________________   FINAL CLINICAL IMPRESSION(S) /  ED DIAGNOSES  Cough Chest pain Shortness of breath   Minna Antis, MD 01/27/20 1456

## 2020-01-27 NOTE — ED Notes (Signed)
Patient states she is going to call her dad for a ride home.

## 2020-01-27 NOTE — ED Notes (Signed)
Patient given cup of water before discharge.

## 2020-01-27 NOTE — ED Provider Notes (Signed)
Procedures     ----------------------------------------- 5:34 PM on 01/27/2020 -----------------------------------------  CT angiogram is unremarkable, no PE edema or infectious infiltrate.  The patient is still having a wheezy cough, but overall feels better after Solu-Medrol and bronchodilators.  Repeat troponin currently in the lab.  If not significantly changed, will plan to discharge, treat for asthma exacerbation with prednisone and azithromycin, continue inhalers at home.  Follow-up with primary care.    Sharman Cheek, MD 01/27/20 [phone]

## 2020-02-06 ENCOUNTER — Emergency Department: Payer: Medicaid Other

## 2020-02-06 ENCOUNTER — Other Ambulatory Visit: Payer: Self-pay

## 2020-02-06 DIAGNOSIS — R079 Chest pain, unspecified: Secondary | ICD-10-CM | POA: Diagnosis present

## 2020-02-06 DIAGNOSIS — Z5321 Procedure and treatment not carried out due to patient leaving prior to being seen by health care provider: Secondary | ICD-10-CM | POA: Insufficient documentation

## 2020-02-06 LAB — BASIC METABOLIC PANEL
Anion gap: 11 (ref 5–15)
BUN: 16 mg/dL (ref 6–20)
CO2: 25 mmol/L (ref 22–32)
Calcium: 9.3 mg/dL (ref 8.9–10.3)
Chloride: 100 mmol/L (ref 98–111)
Creatinine, Ser: 0.9 mg/dL (ref 0.44–1.00)
GFR calc Af Amer: >60 mL/min (ref >60)
GFR calc non Af Amer: >60 mL/min (ref >60)
Glucose, Bld: 107 mg/dL — ABNORMAL HIGH (ref 70–99)
Potassium: 3 mmol/L — ABNORMAL LOW (ref 3.5–5.1)
Sodium: 136 mmol/L (ref 135–145)

## 2020-02-06 LAB — CBC
HCT: 36.2 % (ref 36.0–46.0)
Hemoglobin: 11.7 g/dL — ABNORMAL LOW (ref 12.0–15.0)
MCH: 27.7 pg (ref 26.0–34.0)
MCHC: 32.3 g/dL (ref 30.0–36.0)
MCV: 85.8 fL (ref 80.0–100.0)
Platelets: 302 10*3/uL (ref 150–400)
RBC: 4.22 MIL/uL (ref 3.87–5.11)
RDW: 14.7 % (ref 11.5–15.5)
WBC: 15.4 10*3/uL — ABNORMAL HIGH (ref 4.0–10.5)
nRBC: 0 % (ref 0.0–0.2)

## 2020-02-06 LAB — TROPONIN I (HIGH SENSITIVITY): Troponin I (High Sensitivity): 12 ng/L (ref <18)

## 2020-02-06 NOTE — ED Notes (Signed)
Pt refused 2nd troponin blood draw.

## 2020-02-06 NOTE — ED Triage Notes (Signed)
Pt reports that around 1pm today she began to experience indigestion at home that was causing pain in the central chest and up to the throat. Pt states she attempted many home remedies with no relief and the pain has continued. Pt reports pain with swallowing.

## 2020-02-06 NOTE — ED Notes (Addendum)
Pt upset over wait time, st "no one is telling me anything or doing nothing"; pt updated on wait time & explained plan of care; taken to triage for vs and repeat troponin; pt refuses blood draw; explained purpose of such and importance of having performed now to rule out any emergent conditions but pt is adamant that she is getting no more blood drawn despite information given

## 2020-02-07 ENCOUNTER — Emergency Department
Admission: EM | Admit: 2020-02-07 | Discharge: 2020-02-07 | Disposition: A | Discharge status: Home / Self Care | Payer: Medicaid Other | Attending: Emergency Medicine | Admitting: Emergency Medicine

## 2020-02-07 NOTE — ED Notes (Signed)
No answer when called several times from lobby 

## 2020-05-24 ENCOUNTER — Other Ambulatory Visit: Payer: Self-pay

## 2020-05-24 ENCOUNTER — Encounter: Payer: Self-pay | Admitting: Emergency Medicine

## 2020-05-24 ENCOUNTER — Emergency Department
Admission: EM | Admit: 2020-05-24 | Discharge: 2020-05-24 | Disposition: A | Discharge status: Home / Self Care | Payer: Medicaid Other | Attending: Emergency Medicine | Admitting: Emergency Medicine

## 2020-05-24 DIAGNOSIS — R220 Localized swelling, mass and lump, head: Secondary | ICD-10-CM

## 2020-05-24 DIAGNOSIS — K0889 Other specified disorders of teeth and supporting structures: Secondary | ICD-10-CM | POA: Diagnosis not present

## 2020-05-24 DIAGNOSIS — I10 Essential (primary) hypertension: Secondary | ICD-10-CM | POA: Insufficient documentation

## 2020-05-24 DIAGNOSIS — J45909 Unspecified asthma, uncomplicated: Secondary | ICD-10-CM | POA: Diagnosis not present

## 2020-05-24 DIAGNOSIS — Z8673 Personal history of transient ischemic attack (TIA), and cerebral infarction without residual deficits: Secondary | ICD-10-CM | POA: Insufficient documentation

## 2020-05-24 DIAGNOSIS — Z87891 Personal history of nicotine dependence: Secondary | ICD-10-CM | POA: Diagnosis not present

## 2020-05-24 MED ORDER — CEFTRIAXONE SODIUM 1 G IJ SOLR
1.0000 g | Freq: Once | INTRAMUSCULAR | Status: AC
  Administered 2020-05-24: 1 g via INTRAMUSCULAR
  Filled 2020-05-24: qty 10

## 2020-05-24 MED ORDER — AMOXICILLIN-POT CLAVULANATE 875-125 MG PO TABS
1.0000 | ORAL_TABLET | Freq: Two times a day (BID) | ORAL | 0 refills | Status: AC
Start: 2020-05-24 — End: 2020-06-03

## 2020-05-24 NOTE — ED Provider Notes (Signed)
[facility] Emergency Department Provider Note  ____________________________________________  Time seen: Approximately 10:07 AM  I have reviewed the triage vital signs and the nursing notes.   HISTORY  Chief Complaint Dental Pain    HPI Tiffany Hickman is a 39 y.o. female that presents to the emergency department for evaluation of left-sided facial pain and swelling for 2 days.  Patient states that her left upper molar is painful.  She can also feel a knot under her chin.  She is able to open her mouth easily but is having difficulty closing it.  No known fevers.   Past Medical History:  Diagnosis Date  . Abscess, peritonsillar   . Asthma   . Back pain   . Cubital tunnel syndrome   . DOE (dyspnea on exertion)   . Hidradenitis suppurativa   . Hypertension   . Hypertension   . Patellar tendinitis   . Prediabetes   . Stroke (HCC)   . Unspecified disorder of binocular vision     Patient Active Problem List   Diagnosis Date Noted  . Chronic heart failure with preserved ejection fraction (HFpEF) (HCC) 02/24/2019  . Mitral valve disease 02/24/2019  . Essential hypertension 02/24/2019    Past Surgical History:  Procedure Laterality Date  . SKIN GRAFT     LEFT FOOT    Prior to Admission medications   Medication Sig Start Date End Date Taking? Authorizing Provider  albuterol (PROVENTIL HFA) 108 (90 Base) MCG/ACT inhaler Inhale 2 puffs into the lungs every 4 (four) hours as needed for wheezing or shortness of breath. 01/27/20   Sharman Cheek, MD  albuterol (PROVENTIL HFA;VENTOLIN HFA) 108 (90 Base) MCG/ACT inhaler Inhale 2 puffs into the lungs every 6 (six) hours as needed for wheezing or shortness of breath. 09/15/18   Joni Reining, PA-C  amoxicillin-clavulanate (AUGMENTIN) 875-125 MG tablet Take 1 tablet by mouth 2 (two) times daily for 10 days. 05/24/20 06/03/20  Enid Derry, PA-C  azithromycin (ZITHROMAX Z-PAK) 250 MG tablet Take 2  tablets (500 mg) on  Day 1,  followed by 1 tablet (250 mg) once daily on Days 2 through 5. 01/27/20   Sharman Cheek, MD  doxycycline (VIBRAMYCIN) 100 MG capsule Take 1 capsule (100 mg total) by mouth 2 (two) times daily. 09/29/19   Tommi Rumps, PA-C  furosemide (LASIX) 20 MG tablet Take 1 tablet (20 mg total) by mouth daily as needed for up to 20 days for edema (and shortness of breath.). 02/24/19 03/16/19  End, Cristal Deer, MD  guaiFENesin (ROBITUSSIN) 100 MG/5ML SOLN Take 5 mLs (100 mg total) by mouth every 4 (four) hours as needed for cough or to loosen phlegm. 01/27/20   Sharman Cheek, MD  hydrochlorothiazide (HYDRODIURIL) 25 MG tablet Take 25 mg by mouth daily.    [provider]  metFORMIN (GLUCOPHAGE) 500 MG tablet Take 500 mg by mouth daily with breakfast.    [provider]  metoprolol succinate (TOPROL XL) 25 MG 24 hr tablet Take 1 tablet (25 mg total) by mouth daily. 02/24/19   End, Cristal Deer, MD  predniSONE (DELTASONE) 20 MG tablet Take 2 tablets (40 mg total) by mouth daily. 01/27/20   Sharman Cheek, MD  traMADol (ULTRAM) 50 MG tablet Take 1 tablet (50 mg total) by mouth every 6 (six) hours as needed. 09/29/19   Tommi Rumps, PA-C    Allergies Patient has no known allergies.  Family History  Problem Relation Age of Onset  . Diabetes Mother   .  Hypertension Mother   . Hypertension Father   . COPD Father     Social History Social History   Tobacco Use  . Smoking status: Former Smoker    Years: 1.00  . Smokeless tobacco: Never Used  Substance Use Topics  . Alcohol use: Not Currently  . Drug use: No     Review of Systems  Constitutional: No fever/chills ENT: No upper respiratory complaints. Cardiovascular: No chest pain. Respiratory: No SOB. Gastrointestinal: No abdominal pain.  No nausea, no vomiting.  Musculoskeletal: Negative for musculoskeletal pain. Skin: Negative for rash, abrasions, lacerations, ecchymosis. Neurological:  Negative for headaches   ____________________________________________   PHYSICAL EXAM:  VITAL SIGNS: ED Triage Vitals  Enc Vitals Group     BP --      Pulse Rate 05/24/20 0836 (!) 105     Resp 05/24/20 0836 18     Temp 05/24/20 0836 98.7 F (37.1 C)     Temp Source 05/24/20 0836 Oral     SpO2 05/24/20 0836 100 %     Weight 05/24/20 0831 270 lb (122.5 kg)     Height 05/24/20 0831 5\' 6"  (1.676 m)     Head Circumference --      Peak Flow --      Pain Score 05/24/20 0831 8     Pain Loc --      Pain Edu? --      Excl. in GC? --      Constitutional: Alert and oriented. Well appearing and in no acute distress. Eyes: Conjunctivae are normal. PERRL. EOMI. Head: Atraumatic. ENT:      Ears:      Nose: No congestion/rhinnorhea.      Mouth/Throat: Mucous membranes are moist.  Moderate left cheek swelling.  Poor dentition.  Left top molar decayed to the gumline with surrounding tenderness to palpation.  Tenderness to palpation to left parotid gland. Neck: No stridor.  Cardiovascular: Normal rate, regular rhythm.  Good peripheral circulation. Respiratory: Normal respiratory effort without tachypnea or retractions. Lungs CTAB. Good air entry to the bases with no decreased or absent breath sounds. Gastrointestinal: Bowel sounds 4 quadrants. Soft and nontender to palpation. No guarding or rigidity. No palpable masses. No distention.  Musculoskeletal: Full range of motion to all extremities. No gross deformities appreciated. Neurologic:  Normal speech and language. No gross focal neurologic deficits are appreciated.  Skin:  Skin is warm, dry and intact. No rash noted. Psychiatric: Mood and affect are normal. Speech and behavior are normal. Patient exhibits appropriate insight and judgement.   ____________________________________________   LABS (all labs ordered are listed, but only abnormal results are displayed)  Labs Reviewed - No data to  display ____________________________________________  EKG   ____________________________________________  RADIOLOGY   No results found.  ____________________________________________    PROCEDURES  Procedure(s) performed:    Procedures    Medications  cefTRIAXone (ROCEPHIN) injection 1 g (1 g Intramuscular Given 05/24/20 1028)     ____________________________________________   INITIAL IMPRESSION / ASSESSMENT AND PLAN / ED COURSE  Pertinent labs & imaging results that were available during my care of the patient were reviewed by me and considered in my medical decision making (see chart for details).  Review of the [place] CSRS was performed in accordance of the [facility] prior to dispensing any controlled drugs.  Patient left AGAINST MEDICAL ADVICE.   Patient presented to emergency department for evaluation of dental pain for 2 days.  Vital signs and exam are reassuring.  Symptoms and  pain may be due to a dental abscess or parotitis or other infection.  I would like to do labs and CT scan to further evaluate.  Patient refuses lab work and CT scan today.  She understands the risks of worsening infection.  She does agree to a shot of IM Rocephin and oral antibiotics.  Patient will sign out AGAINST MEDICAL ADVICE.    Tiffany Hickman was evaluated in Emergency Department on 05/24/2020 for the symptoms described in the history of present illness. She was evaluated in the context of the global COVID-19 pandemic, which necessitated consideration that the patient might be at risk for infection with the SARS-CoV-2 virus that causes COVID-19. Institutional protocols and algorithms that pertain to the evaluation of patients at risk for COVID-19 are in a state of rapid change based on information released by regulatory bodies including the CDC and federal and state organizations. These policies and algorithms were followed during the patient's care in the  ED.    ____________________________________________  FINAL CLINICAL IMPRESSION(S) / ED DIAGNOSES  Final diagnoses:  Facial swelling      NEW MEDICATIONS STARTED DURING THIS VISIT:  ED Discharge Orders         Ordered    amoxicillin-clavulanate (AUGMENTIN) 875-125 MG tablet  2 times daily        05/24/20 1008              This chart was dictated using voice recognition software/Dragon. Despite best efforts to proofread, errors can occur which can change the meaning. Any change was purely unintentional.    Enid Derry, PA-C 05/24/20 1535    Minna Antis, MD 05/25/20 1313

## 2020-05-24 NOTE — ED Triage Notes (Signed)
Pt comes into the ED via POV c/o Left upper dental pain. Pt states it started 2 days ago and she now has facial swelling with it.  Pt in NAD.

## 2020-08-12 IMAGING — CR CHEST - 2 VIEW
2 series · 2 of 2 positions shown · non-contrast
Comparison: None.

CLINICAL DATA: Acute onset of wheezing.

EXAM:
CHEST - 2 VIEW

[chest pa]
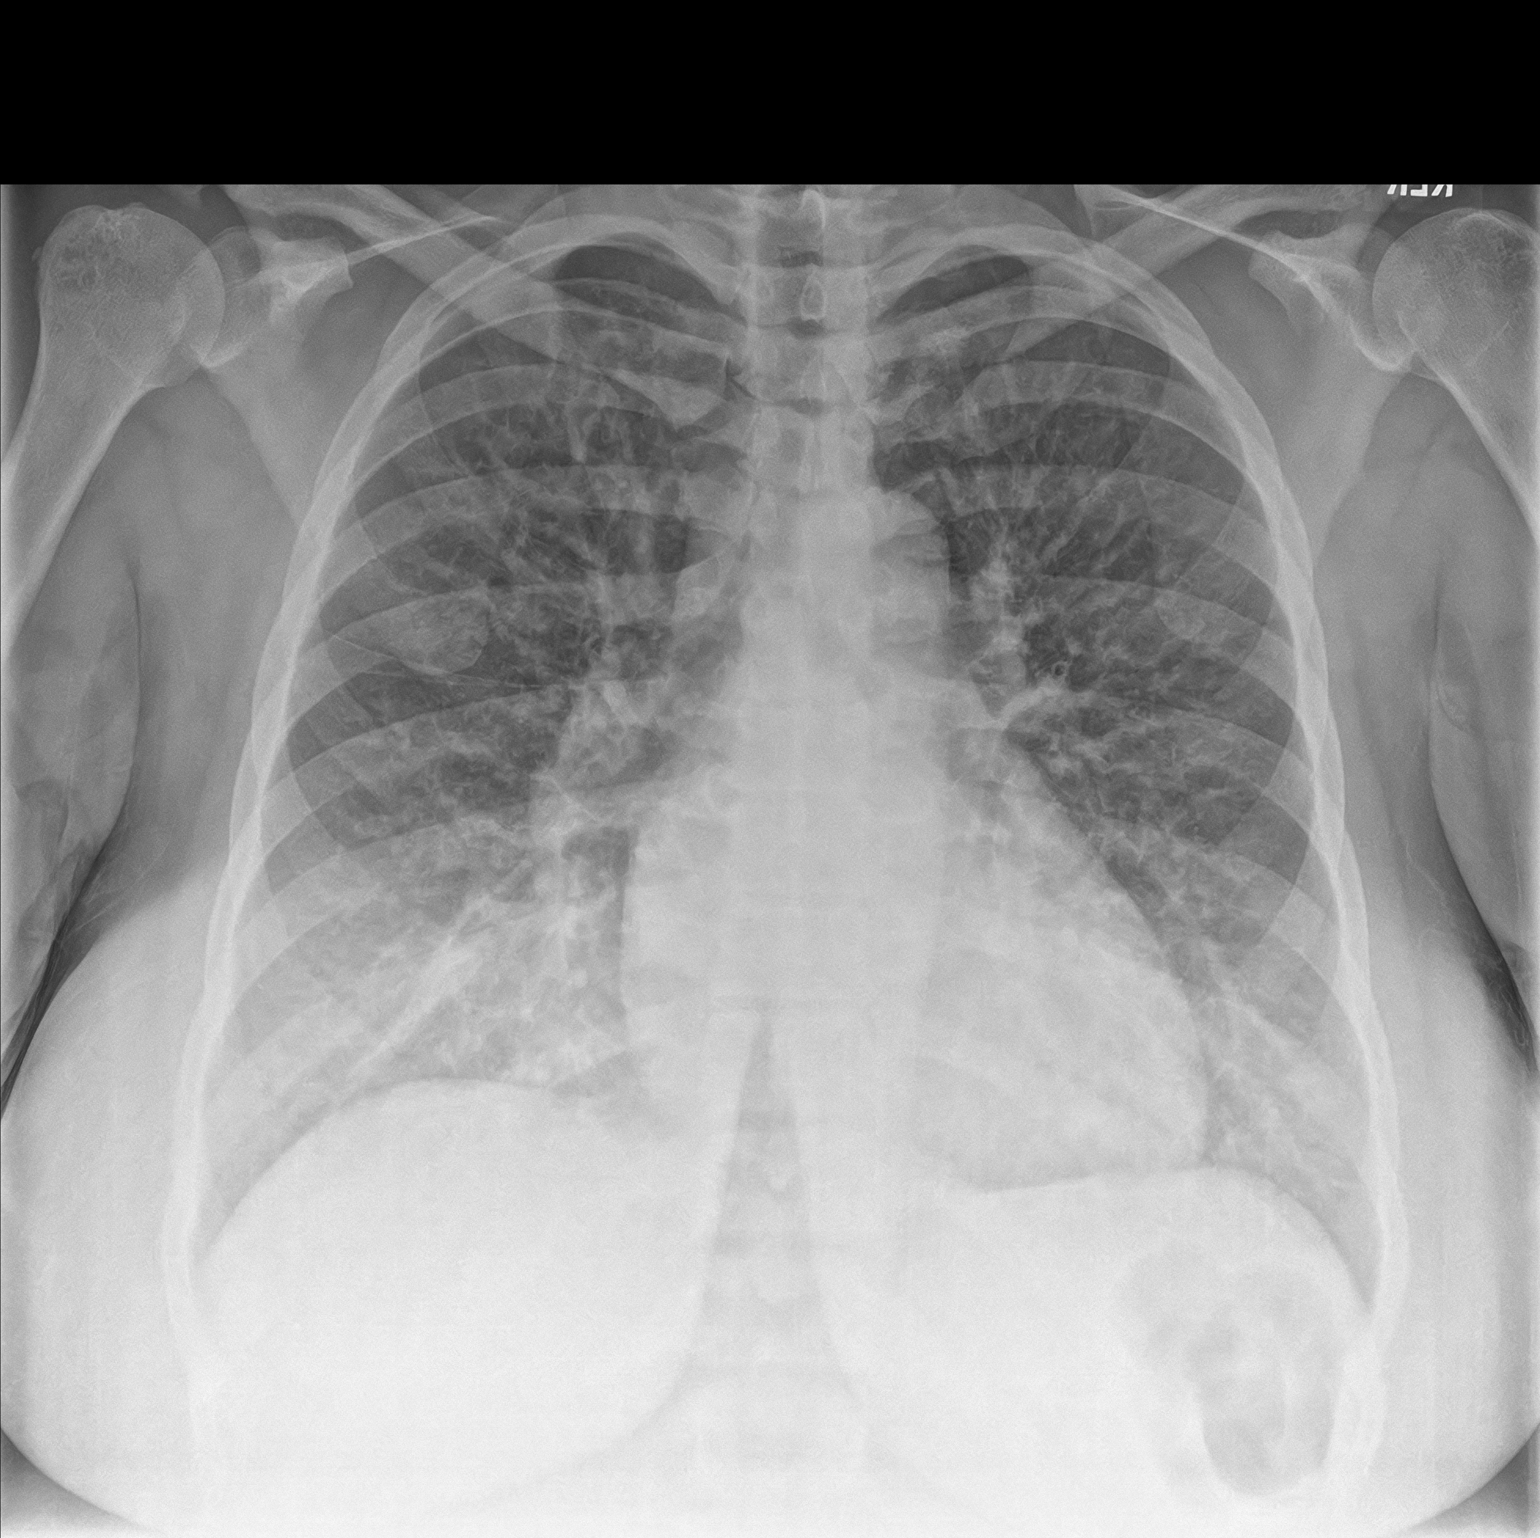

[chest lat]
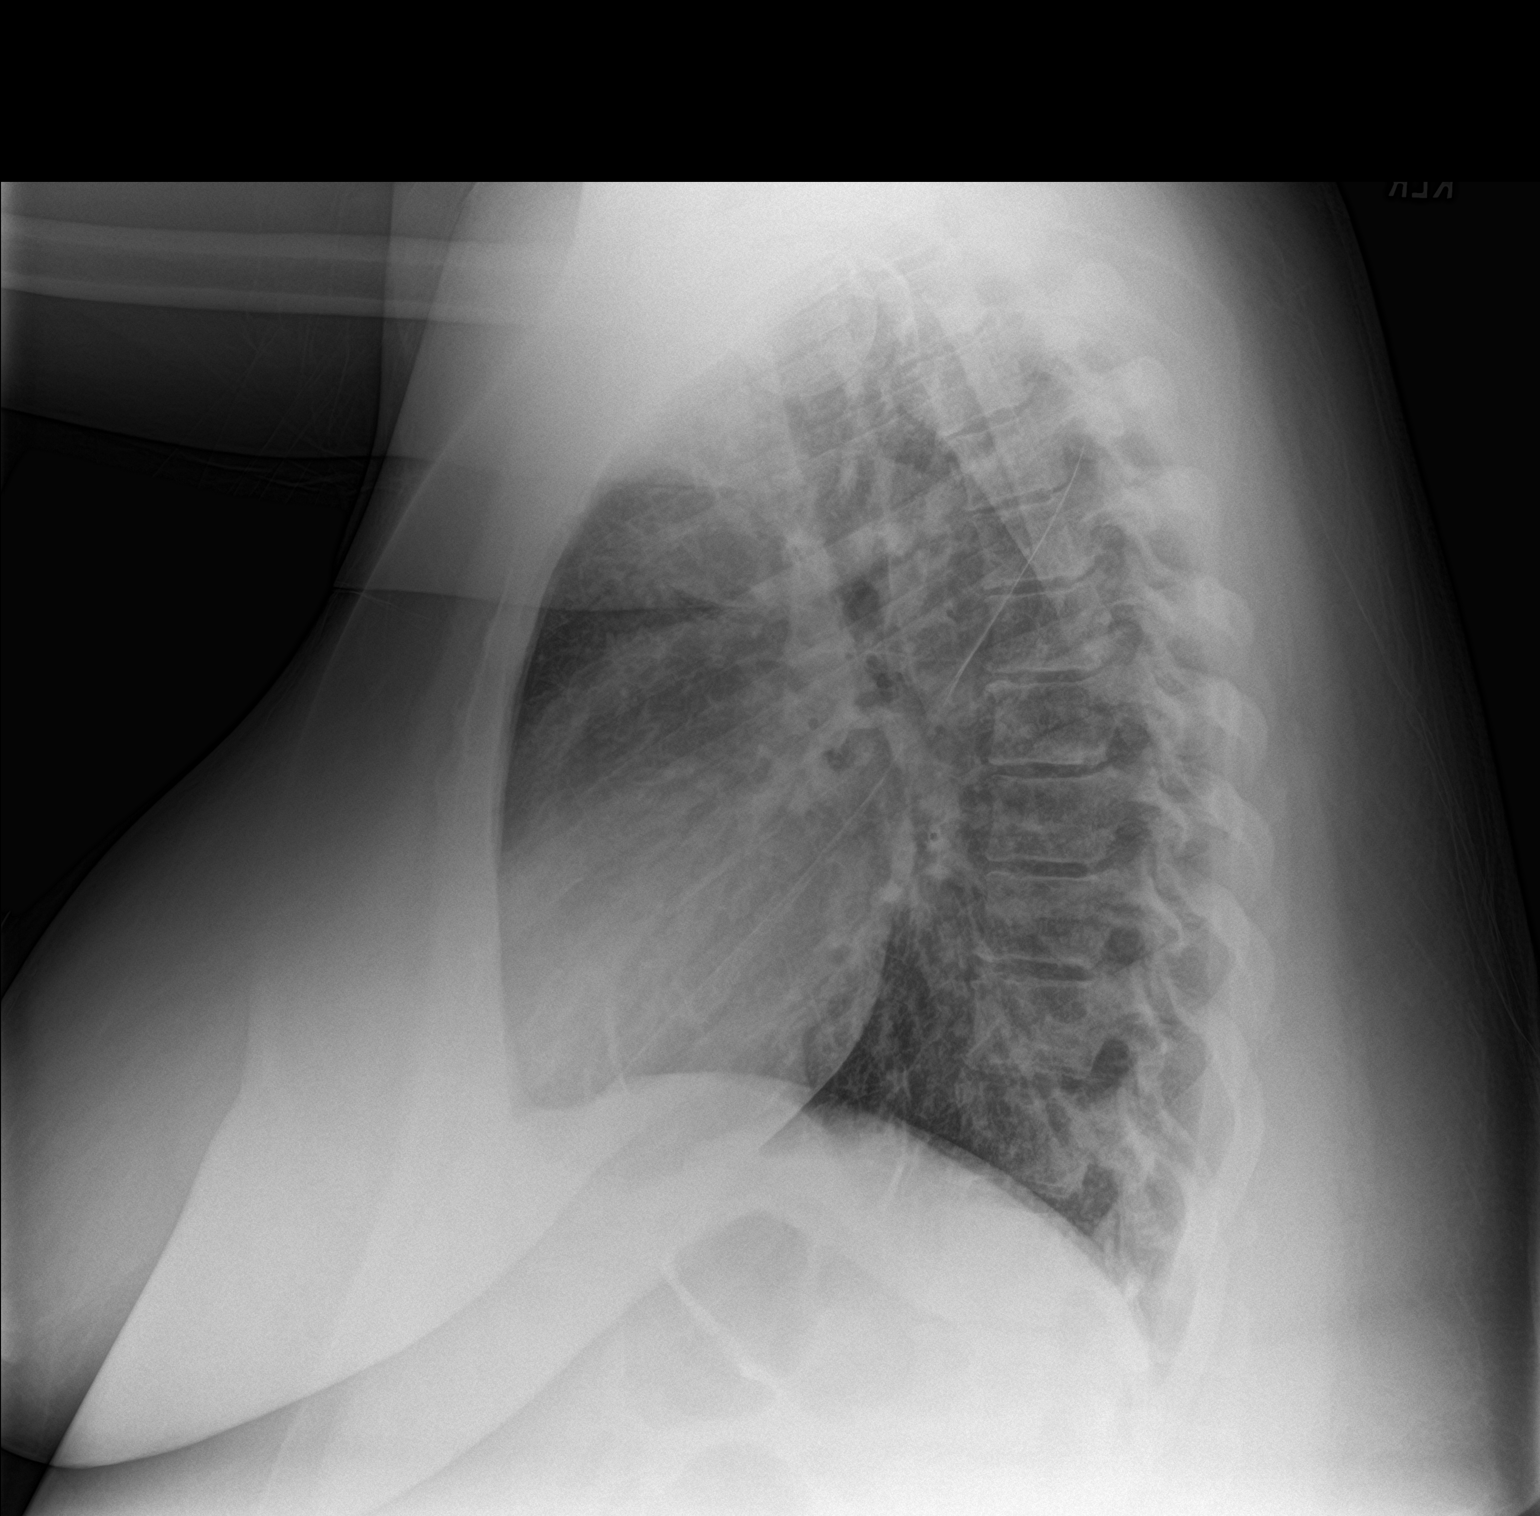

[2 of 2 positions shown; findings below may reference images not displayed]

FINDINGS: The lungs are well-aerated. Mild patchy bibasilar airspace opacities
may reflect mild interstitial edema. Vascular congestion is noted.
There is no evidence of pleural effusion or pneumothorax.

The heart is normal in size; the mediastinal contour is within
normal limits. No acute osseous abnormalities are seen.
IMPRESSION: Mild patchy bibasilar airspace opacities may reflect mild
interstitial edema. Vascular congestion noted.

## 2020-11-23 ENCOUNTER — Ambulatory Visit: Payer: Medicaid Other | Admitting: Nurse Practitioner

## 2020-11-23 NOTE — Progress Notes (Deleted)
   Follow-up Outpatient Visit Date: 11/23/2020  Primary Care Provider: [facility] [address] [place] [address]  Chief Complaint: ***  HPI:  Ms. Paradiso is a 40 y.o. female with history of mitral stenosis, hypertension, prediabetes, and stroke, who presents for follow-up of mitral valve disease.  We spoke once via virtual visit in 02/2019, at which time she reported episodic shortness of breath and palpitations.  Preceding echo on 02/11/2019 showed normal LVEF with rheumatic appearing mitral valve with moderate to severe stenosis and mild to moderate regurgitation (mean mitral valve gradient 14 mmHg).  Symptoms were felt to be due to HFpEF in the setting of her valvular heart disease.  There is also concern for PAF given her episodic palpitations.  We agreed to add metoprolol succinate 25 mg daily with plans for ambulatory monitoring when the patient return for follow-up in 1 month.  Unfortunately, she has been a no-show for multiple appointments before and after our initial consultation.  --------------------------------------------------------------------------------------------------  Past Medical History:  Diagnosis Date  . Abscess, peritonsillar   . Asthma   . Back pain   . Cubital tunnel syndrome   . DOE (dyspnea on exertion)   . Hidradenitis suppurativa   . Hypertension   . Hypertension   . Patellar tendinitis   . Prediabetes   . Stroke (HCC)   . Unspecified disorder of binocular vision    Past Surgical History:  Procedure Laterality Date  . SKIN GRAFT     LEFT FOOT    No outpatient medications have been marked as taking for the 11/23/20 encounter (Appointment) with Dazhane Villagomez, Cristal Deer, MD.    Allergies: Patient has no known allergies.  Social History   Tobacco Use  . Smoking status: Former Smoker    Years: 1.00  . Smokeless tobacco: Never Used  Substance Use Topics  . Alcohol use: Not Currently  . Drug use: No    Family History   Problem Relation Age of Onset  . Diabetes Mother   . Hypertension Mother   . Hypertension Father   . COPD Father     Review of Systems: A 12-system review of systems was performed and was negative except as noted in the HPI.  --------------------------------------------------------------------------------------------------  Physical Exam: There were no vitals taken for this visit.  General:  NAD. Neck: No JVD or HJR. Lungs: Clear to auscultation bilaterally without wheezes or crackles. Heart: Regular rate and rhythm without murmurs, rubs, or gallops. Abdomen: Soft, nontender, nondistended. Extremities: No lower extremity edema.  EKG:  ***  Lab Results  Component Value Date   WBC 15.4 (H) 02/06/2020   HGB 11.7 (L) 02/06/2020   HCT 36.2 02/06/2020   MCV 85.8 02/06/2020   PLT 302 02/06/2020    Lab Results  Component Value Date   NA 136 02/06/2020   K 3.0 (L) 02/06/2020   CL 100 02/06/2020   CO2 25 02/06/2020   BUN 16 02/06/2020   CREATININE 0.90 02/06/2020   GLUCOSE 107 (H) 02/06/2020   ALT 18 09/29/2019    No results found for: CHOL, HDL, LDLCALC, LDLDIRECT, TRIG, CHOLHDL  --------------------------------------------------------------------------------------------------  ASSESSMENT AND PLAN: ***  Yvonne Kendall, MD 11/23/2020 7:12 AM

## 2021-02-25 ENCOUNTER — Other Ambulatory Visit: Payer: Medicaid Other

## 2021-06-12 ENCOUNTER — Emergency Department
Admission: EM | Admit: 2021-06-12 | Discharge: 2021-06-12 | Disposition: A | Discharge status: Home / Self Care | Payer: Medicaid Other | Attending: Emergency Medicine | Admitting: Emergency Medicine

## 2021-06-12 ENCOUNTER — Other Ambulatory Visit: Payer: Self-pay

## 2021-06-12 DIAGNOSIS — Z87891 Personal history of nicotine dependence: Secondary | ICD-10-CM | POA: Diagnosis not present

## 2021-06-12 DIAGNOSIS — G5601 Carpal tunnel syndrome, right upper limb: Secondary | ICD-10-CM | POA: Diagnosis not present

## 2021-06-12 DIAGNOSIS — I509 Heart failure, unspecified: Secondary | ICD-10-CM | POA: Diagnosis not present

## 2021-06-12 DIAGNOSIS — J45909 Unspecified asthma, uncomplicated: Secondary | ICD-10-CM | POA: Insufficient documentation

## 2021-06-12 DIAGNOSIS — M79641 Pain in right hand: Secondary | ICD-10-CM | POA: Diagnosis present

## 2021-06-12 DIAGNOSIS — Z79899 Other long term (current) drug therapy: Secondary | ICD-10-CM | POA: Diagnosis not present

## 2021-06-12 DIAGNOSIS — I110 Hypertensive heart disease with heart failure: Secondary | ICD-10-CM | POA: Insufficient documentation

## 2021-06-12 MED ORDER — PREDNISONE 10 MG PO TABS
ORAL_TABLET | ORAL | 0 refills | Status: AC
Start: 2021-06-12 — End: 2021-06-21

## 2021-06-12 MED ORDER — HYDROCODONE-ACETAMINOPHEN 5-325 MG PO TABS: 1.0000 | ORAL_TABLET | Freq: Four times a day (QID) | ORAL | 0 refills | Status: DC | PRN

## 2021-06-12 MED ORDER — OXYCODONE-ACETAMINOPHEN 5-325 MG PO TABS
2.0000 | ORAL_TABLET | Freq: Once | ORAL | Status: AC
  Administered 2021-06-12: 2 via ORAL
  Filled 2021-06-12: qty 2

## 2021-06-12 MED ORDER — KETOROLAC TROMETHAMINE 60 MG/2ML IM SOLN
60.0000 mg | Freq: Once | INTRAMUSCULAR | Status: AC
  Administered 2021-06-12: 60 mg via INTRAMUSCULAR
  Filled 2021-06-12: qty 2

## 2021-06-12 NOTE — ED Provider Notes (Signed)
[facility] Emergency Department Provider Note  ____________________________________________   Event Date/Time   First MD Initiated Contact with Tiffany Hickman 06/12/21 [id number]     (approximate)  I have reviewed the triage vital signs and the nursing notes.   HISTORY  Chief Complaint Hand Pain    HPI Tiffany Hickman is a 41 y.o. female with past medical history as below here with right and left hand pain.  Tiffany Hickman states that over the last 2 to 3 weeks, Tiffany Hickman Hickman had progressively worsening tingling, stabbing, burning pain in her right palm.  This radiates to her right second and third fingers.  It is worse in the mornings and overnight, and gradually improves.  Tiffany Hickman does use her hands a lot at work.  Denies any direct trauma.  No neck pain.  Tiffany Hickman Hickman noticed some mild similar symptoms in her left hand over the last 2 to 3 days.  Tiffany Hickman Hickman a history of cubital tunnel syndrome but no known history of carpal tunnel.  Denies any persistent numbness or weakness.  As mentioned, no neck pain.  No fevers or chills.  No rash.    Past Medical History:  Diagnosis Date   Abscess, peritonsillar    Asthma    Back pain    Cubital tunnel syndrome    DOE (dyspnea on exertion)    Hidradenitis suppurativa    Hypertension    Hypertension    Patellar tendinitis    Prediabetes    Stroke [facility])    Unspecified disorder of binocular vision     Tiffany Hickman Active Problem List   Diagnosis Date Noted   Chronic heart failure with preserved ejection fraction (HFpEF) ([place]) 02/24/2019   Mitral valve disease 02/24/2019   Essential hypertension 02/24/2019    Past Surgical History:  Procedure Laterality Date   SKIN GRAFT     LEFT FOOT    Prior to Admission medications   Medication Sig Start Date End Date Taking? Authorizing Provider  HYDROcodone-acetaminophen (NORCO/VICODIN) 5-325 MG tablet Take 1-2 tablets by mouth every 6 (six) hours as needed for moderate pain or severe pain. 06/12/21  06/12/22 Yes Duffy Bruce, MD  predniSONE (DELTASONE) 10 MG tablet Take 4 tablets (40 mg total) by mouth daily for 3 days, THEN 2 tablets (20 mg total) daily for 3 days, THEN 1 tablet (10 mg total) daily for 3 days. 06/12/21 06/21/21 Yes Duffy Bruce, MD  albuterol (PROVENTIL HFA) 108 (90 Base) MCG/ACT inhaler Inhale 2 puffs into the lungs every 4 (four) hours as needed for wheezing or shortness of breath. 01/27/20   Carrie Mew, MD  albuterol (PROVENTIL HFA;VENTOLIN HFA) 108 (90 Base) MCG/ACT inhaler Inhale 2 puffs into the lungs every 6 (six) hours as needed for wheezing or shortness of breath. 09/15/18   Sable Feil, PA-C  azithromycin (ZITHROMAX Z-PAK) 250 MG tablet Take 2 tablets (500 mg) on  Day 1,  followed by 1 tablet (250 mg) once daily on Days 2 through 5. 01/27/20   Carrie Mew, MD  doxycycline (VIBRAMYCIN) 100 MG capsule Take 1 capsule (100 mg total) by mouth 2 (two) times daily. 09/29/19   Johnn Hai, PA-C  furosemide (LASIX) 20 MG tablet Take 1 tablet (20 mg total) by mouth daily as needed for up to 20 days for edema (and shortness of breath.). 02/24/19 03/16/19  End, Harrell Gave, MD  guaiFENesin (ROBITUSSIN) 100 MG/5ML SOLN Take 5 mLs (100 mg total) by mouth every 4 (four) hours as needed for cough or  to loosen phlegm. 01/27/20   Carrie Mew, MD  hydrochlorothiazide (HYDRODIURIL) 25 MG tablet Take 25 mg by mouth daily.    [provider]  metFORMIN (GLUCOPHAGE) 500 MG tablet Take 500 mg by mouth daily with breakfast.    [provider]  metoprolol succinate (TOPROL XL) 25 MG 24 hr tablet Take 1 tablet (25 mg total) by mouth daily. 02/24/19   End, Harrell Gave, MD  traMADol (ULTRAM) 50 MG tablet Take 1 tablet (50 mg total) by mouth every 6 (six) hours as needed. 09/29/19   Johnn Hai, PA-C    Allergies Tiffany Hickman no known allergies.  Family History  Problem Relation Age of Onset   Diabetes Mother    Hypertension Mother    Hypertension  Father    COPD Father     Social History Social History   Tobacco Use   Smoking status: Former    Years: 1.00    Types: Cigarettes   Smokeless tobacco: Never  Substance Use Topics   Alcohol use: Not Currently   Drug use: No    Review of Systems  Review of Systems  Constitutional:  Negative for chills, fatigue and fever.  HENT:  Negative for congestion and sore throat.   Eyes:  Negative for visual disturbance.  Respiratory:  Negative for cough and shortness of breath.   Cardiovascular:  Negative for chest pain.  Gastrointestinal:  Negative for abdominal pain, diarrhea, nausea and vomiting.  Genitourinary:  Negative for flank pain.  Musculoskeletal:  Positive for arthralgias. Negative for back pain and neck pain.  Skin:  Negative for rash and wound.  Allergic/Immunologic: Negative for immunocompromised state.  Neurological:  Positive for numbness. Negative for weakness.  Hematological:  Does not bruise/bleed easily.  All other systems reviewed and are negative.   ____________________________________________  PHYSICAL EXAM:      VITAL SIGNS: ED Triage Vitals  Enc Vitals Group     BP 06/12/21 0933 132/73     Pulse Rate 06/12/21 0933 97     Resp 06/12/21 0933 16     Temp 06/12/21 0933 97.9 F (36.6 C)     Temp Source 06/12/21 0933 Oral     SpO2 06/12/21 0933 99 %     Weight 06/12/21 0934 267 lb (121.1 kg)     Height 06/12/21 0934 5\' 6"  (1.676 m)     Head Circumference --      Peak Flow --      Pain Score 06/12/21 0934 9     Pain Loc --      Pain Edu? --      Excl. in [place]? --      Physical Exam Vitals and nursing note reviewed.  Constitutional:      General: Tiffany Hickman is not in acute distress.    Appearance: Tiffany Hickman is well-developed.  HENT:     Head: Normocephalic and atraumatic.  Eyes:     Conjunctiva/sclera: Conjunctivae normal.  Cardiovascular:     Rate and Rhythm: Normal rate and regular rhythm.     Heart sounds: Normal heart sounds. No murmur heard.   No  friction rub.  Pulmonary:     Effort: Pulmonary effort is normal. No respiratory distress.     Breath sounds: Normal breath sounds. No wheezing or rales.  Abdominal:     General: There is no distension.     Palpations: Abdomen is soft.     Tenderness: There is no abdominal tenderness.  Musculoskeletal:     Cervical back:  Neck supple.  Skin:    General: Skin is warm.     Capillary Refill: Capillary refill takes less than 2 seconds.  Neurological:     Mental Status: Tiffany Hickman is alert and oriented to person, place, and time.     Motor: No abnormal muscle tone.     UPPER EXTREMITY EXAM: BILATERAL  INSPECTION & PALPATION: No swelling bilaterally.  Mild subjective diminished sensation to light touch along the palmar aspect of the right hand, extending into the palmar aspects of the second through fourth digits.  No diminished sensation on the left. +Phalen and Tinel on Right.   MOTOR:  + Motor posterior interosseous nerve (thumb IP extension) + Anterior interosseous nerve (thumb IP flexion, index finger DIP flexion) + Radial nerve (wrist extension) + Median nerve (palpable firing thenar mass) + Ulnar nerve (palpable firing of first dorsal interosseous muscle)  VASCULAR: 2+ radial pulse Brisk capillary refill < 2 sec, fingers warm and well-perfused  ____________________________________________   LABS (all labs ordered are listed, but only abnormal results are displayed)  Labs Reviewed - No data to display  ____________________________________________  EKG:  ________________________________________  RADIOLOGY All imaging, including plain films, CT scans, and ultrasounds, independently reviewed by me, and interpretations confirmed via formal radiology reads.  ED MD interpretation:    Official radiology report(s): No results found.  ____________________________________________  PROCEDURES   Procedure(s) performed (including Critical  Care):  Procedures  ____________________________________________  INITIAL IMPRESSION / MDM / ASSESSMENT AND PLAN / ED COURSE  As part of my medical decision making, I reviewed the following data within the electronic MEDICAL RECORD NUMBER Nursing notes reviewed and incorporated, Old chart reviewed, Notes from prior ED visits, and  Controlled Substance Database       *XOCHIL SHANKER was Hickman in Emergency Department on 06/12/2021 for the symptoms described in the history of present illness. Tiffany Hickman in the context of the global COVID-19 pandemic, which necessitated consideration that the Tiffany Hickman might be at risk for infection with the SARS-CoV-2 virus that causes COVID-19. Institutional protocols and algorithms that pertain to the evaluation of patients at risk for COVID-19 are in a state of rapid change based on information released by regulatory bodies including the CDC and federal and state organizations. These policies and algorithms were followed during the Tiffany Hickman's care in the ED.  Some ED evaluations and interventions may be delayed as a result of limited staffing during the pandemic.*     Medical Decision Making: 40 year old female here with right hand tingling and pain, as well as mild symptoms on the left.  Suspect carpal tunnel syndrome based on exam findings.  Tiffany Hickman reproducible pain along distribution of median nerve on exam with +tinel's, phalen test. Tiffany Hickman Hickman no signs of thenar weakness or wasting. No neck pain or other radicular sx to suggest cervical radiculopathy. No recent fever, chills, or infectious sx. No h/o hypothyroidism, autoimmune disease. Will tx with steroids, nsaids, outpt follow-up with Hand, as well as wrist splinting at night PRN.  ____________________________________________  FINAL CLINICAL IMPRESSION(S) / ED DIAGNOSES  Final diagnoses:  Carpal tunnel syndrome of right wrist  Pain of right hand     MEDICATIONS GIVEN DURING THIS  VISIT:  Medications  ketorolac (TORADOL) injection 60 mg (60 mg Intramuscular Given 06/12/21 1115)  oxyCODONE-acetaminophen (PERCOCET/ROXICET) 5-325 MG per tablet 2 tablet (2 tablets Oral Given 06/12/21 1115)     ED Discharge Orders          Ordered  HYDROcodone-acetaminophen (NORCO/VICODIN) 5-325 MG tablet  Every 6 hours PRN        06/12/21 1100    predniSONE (DELTASONE) 10 MG tablet        06/12/21 1100             Note:  This document was prepared using Dragon voice recognition software and may include unintentional dictation errors.   Duffy Bruce, MD 06/12/21 1121

## 2021-06-12 NOTE — ED Triage Notes (Signed)
Pt reports that she has been having right hand pain for weeks when she sleeps at night she wakes up with pain, reports last night bilat hands were hurting, pt denies any know neck injury

## 2021-06-12 NOTE — Discharge Instructions (Signed)
Wear the brace at night for the next week, to see if improves your symptoms  Take the steroids and pain meds as needed  You can take over-the-counter Ibuprofen or Alleve as well

## 2021-06-12 NOTE — ED Notes (Signed)
Pt to ED c/o bilateral hand pain since 3 weeks. Pt wakes up several times/night due to pain. Complains of numbness and tingling and swelling in both hands. States R hand, 4th finger also has shooting pain.

## 2021-08-19 ENCOUNTER — Other Ambulatory Visit: Payer: Self-pay

## 2021-08-19 ENCOUNTER — Emergency Department
Admission: EM | Admit: 2021-08-19 | Discharge: 2021-08-19 | Disposition: A | Discharge status: Home / Self Care | Payer: Medicaid Other | Attending: Emergency Medicine | Admitting: Emergency Medicine

## 2021-08-19 ENCOUNTER — Emergency Department: Payer: Medicaid Other

## 2021-08-19 DIAGNOSIS — D72829 Elevated white blood cell count, unspecified: Secondary | ICD-10-CM | POA: Insufficient documentation

## 2021-08-19 DIAGNOSIS — Z8673 Personal history of transient ischemic attack (TIA), and cerebral infarction without residual deficits: Secondary | ICD-10-CM | POA: Diagnosis not present

## 2021-08-19 DIAGNOSIS — Z7951 Long term (current) use of inhaled steroids: Secondary | ICD-10-CM | POA: Insufficient documentation

## 2021-08-19 DIAGNOSIS — I110 Hypertensive heart disease with heart failure: Secondary | ICD-10-CM | POA: Diagnosis not present

## 2021-08-19 DIAGNOSIS — E876 Hypokalemia: Secondary | ICD-10-CM | POA: Diagnosis not present

## 2021-08-19 DIAGNOSIS — J45901 Unspecified asthma with (acute) exacerbation: Secondary | ICD-10-CM | POA: Diagnosis not present

## 2021-08-19 DIAGNOSIS — R0602 Shortness of breath: Secondary | ICD-10-CM | POA: Diagnosis present

## 2021-08-19 DIAGNOSIS — I509 Heart failure, unspecified: Secondary | ICD-10-CM | POA: Insufficient documentation

## 2021-08-19 DIAGNOSIS — Z79899 Other long term (current) drug therapy: Secondary | ICD-10-CM | POA: Insufficient documentation

## 2021-08-19 LAB — CBC
HCT: 34.3 % — ABNORMAL LOW (ref 36.0–46.0)
Hemoglobin: 10.6 g/dL — ABNORMAL LOW (ref 12.0–15.0)
MCH: 27.5 pg (ref 26.0–34.0)
MCHC: 30.9 g/dL (ref 30.0–36.0)
MCV: 89.1 fL (ref 80.0–100.0)
Platelets: 327 10*3/uL (ref 150–400)
RBC: 3.85 MIL/uL — ABNORMAL LOW (ref 3.87–5.11)
RDW: 15.8 % — ABNORMAL HIGH (ref 11.5–15.5)
WBC: 12.5 10*3/uL — ABNORMAL HIGH (ref 4.0–10.5)
nRBC: 0 % (ref 0.0–0.2)

## 2021-08-19 LAB — BASIC METABOLIC PANEL
Anion gap: 6 (ref 5–15)
BUN: 12 mg/dL (ref 6–20)
CO2: 27 mmol/L (ref 22–32)
Calcium: 8.8 mg/dL — ABNORMAL LOW (ref 8.9–10.3)
Chloride: 101 mmol/L (ref 98–111)
Creatinine, Ser: 0.77 mg/dL (ref 0.44–1.00)
GFR, Estimated: >60 mL/min (ref >60)
Glucose, Bld: 139 mg/dL — ABNORMAL HIGH (ref 70–99)
Potassium: 2.9 mmol/L — ABNORMAL LOW (ref 3.5–5.1)
Sodium: 134 mmol/L — ABNORMAL LOW (ref 135–145)

## 2021-08-19 LAB — TROPONIN I (HIGH SENSITIVITY): Troponin I (High Sensitivity): 10 ng/L (ref <18)

## 2021-08-19 MED ORDER — POTASSIUM CHLORIDE CRYS ER 20 MEQ PO TBCR: 20.0000 meq | EXTENDED_RELEASE_TABLET | Freq: Two times a day (BID) | ORAL | 0 refills | Status: DC

## 2021-08-19 MED ORDER — PREDNISONE 50 MG PO TABS: ORAL_TABLET | ORAL | 0 refills | Status: DC

## 2021-08-19 MED ORDER — ALBUTEROL SULFATE HFA 108 (90 BASE) MCG/ACT IN AERS: 2.0000 | INHALATION_SPRAY | Freq: Four times a day (QID) | RESPIRATORY_TRACT | 2 refills | Status: DC | PRN

## 2021-08-19 MED ORDER — IPRATROPIUM-ALBUTEROL 0.5-2.5 (3) MG/3ML IN SOLN
3.0000 mL | Freq: Once | RESPIRATORY_TRACT | Status: AC
  Administered 2021-08-19: 3 mL via RESPIRATORY_TRACT
  Filled 2021-08-19: qty 3

## 2021-08-19 MED ORDER — POTASSIUM CHLORIDE CRYS ER 20 MEQ PO TBCR
40.0000 meq | EXTENDED_RELEASE_TABLET | Freq: Once | ORAL | Status: AC
  Administered 2021-08-19: 40 meq via ORAL
  Filled 2021-08-19: qty 2

## 2021-08-19 MED ORDER — FUROSEMIDE 40 MG PO TABS
20.0000 mg | ORAL_TABLET | Freq: Once | ORAL | Status: AC
Start: 2021-08-19 — End: 2021-08-19
  Administered 2021-08-19: 20 mg via ORAL
  Filled 2021-08-19: qty 1

## 2021-08-19 MED ORDER — PREDNISONE 20 MG PO TABS
60.0000 mg | ORAL_TABLET | Freq: Once | ORAL | Status: AC
  Administered 2021-08-19: 60 mg via ORAL
  Filled 2021-08-19: qty 3

## 2021-08-19 NOTE — ED Triage Notes (Addendum)
Pt comes via EMs with c/o  asthma. Pt states trouble breathing. Pt given one neb with relief per pt. EMS reports pt does have hx of CHF. BP-180/100 HR-112 98% RA

## 2021-08-19 NOTE — ED Provider Notes (Signed)
[facility] Provider Note    Event Date/Time   First MD Initiated Contact with Patient 08/19/21 1556     (approximate)   History   Shortness of Breath   HPI  Tiffany Hickman is a 41 y.o. female with past medical history of asthma, hidradenitis, mitral valve stenosis who presents with shortness of breath.  Going on for several days.  She has cough productive of green sputum.  Denies chest pain.  Has been using her albuterol at home with minimal relief.  No recent steroids.  Patient denies lower extremity edema.  The patient denies hx of prior DVT/PE, unilateral leg pain/swelling, hormone use, recent surgery, hx of cancer, prolonged immobilization, or hemoptysis.      Past Medical History:  Diagnosis Date   Abscess, peritonsillar    Asthma    Back pain    Cubital tunnel syndrome    DOE (dyspnea on exertion)    Hidradenitis suppurativa    Hypertension    Hypertension    Patellar tendinitis    Prediabetes    Stroke (HCC)    Unspecified disorder of binocular vision     Patient Active Problem List   Diagnosis Date Noted   Chronic heart failure with preserved ejection fraction (HFpEF) (HCC) 02/24/2019   Mitral valve disease 02/24/2019   Essential hypertension 02/24/2019     Physical Exam  Triage Vital Signs: ED Triage Vitals  Enc Vitals Group     BP 08/19/21 1312 (!) 146/76     Pulse Rate 08/19/21 1312 (!) 106     Resp 08/19/21 1312 20     Temp 08/19/21 1312 98.5 F (36.9 C)     Temp src --      SpO2 08/19/21 1312 98 %     Weight 08/19/21 1315 260 lb (117.9 kg)     Height 08/19/21 1315 5\' 6"  (1.676 m)     Head Circumference --      Peak Flow --      Pain Score 08/19/21 1315 0     Pain Loc --      Pain Edu? --      Excl. in GC? --     Most recent vital signs: Vitals:   08/19/21 1616 08/19/21 1623  BP: (!) 120/59   Pulse: 92   Resp: 20   Temp:    SpO2: 100% 100%     General: Awake, no distress.  CV:  Good peripheral  perfusion.  No lower extreme edema  resp:  Patient is tachypneic, lungs are clear Abd:  No distention.  Neuro:             Awake, Alert, Oriented x 3  Other:     ED Results / Procedures / Treatments  Labs (all labs ordered are listed, but only abnormal results are displayed) Labs Reviewed  CBC - Abnormal; Notable for the following components:      Result Value   WBC 12.5 (*)    RBC 3.85 (*)    Hemoglobin 10.6 (*)    HCT 34.3 (*)    RDW 15.8 (*)    All other components within normal limits  BASIC METABOLIC PANEL - Abnormal; Notable for the following components:   Sodium 134 (*)    Potassium 2.9 (*)    Glucose, Bld 139 (*)    Calcium 8.8 (*)    All other components within normal limits  D-DIMER, QUANTITATIVE  TROPONIN I (HIGH SENSITIVITY)  TROPONIN I (HIGH  SENSITIVITY)     EKG  EKG interpreted by myself, right axis deviation, sinus tachycardia, no acute ischemic changes   RADIOLOGY Reviewed the x-ray, no obvious acute cardiopulmonary process, per radiology, mild comments of the perihilar vasculature   PROCEDURES:  Critical Care performed: No  Procedures  The patient is on the cardiac monitor to evaluate for evidence of arrhythmia and/or significant heart rate changes.   MEDICATIONS ORDERED IN ED: Medications  ipratropium-albuterol (DUONEB) 0.5-2.5 (3) MG/3ML nebulizer solution 3 mL (3 mLs Nebulization Given 08/19/21 1620)  predniSONE (DELTASONE) tablet 60 mg (60 mg Oral Given 08/19/21 1617)  potassium chloride SA (KLOR-CON M) CR tablet 40 mEq (40 mEq Oral Given 08/19/21 1618)  furosemide (LASIX) tablet 20 mg (20 mg Oral Given 08/19/21 1630)     IMPRESSION / MDM / ASSESSMENT AND PLAN / ED COURSE  I reviewed the triage vital signs and the nursing notes.                              Differential diagnosis includes, but is not limited to, pulmonary embolism, asthma exacerbation, viral URI, CHF exacerbation  Is a 41 year old female with a history of mitral valve  stenosis and asthma who presents with 4 days of shortness of breath worsening today.  She has an cough productive of green sputum.  No chest pain or lower extremity edema.  On exam she is satting 98% but is tachypneic and mildly tachycardic.  Lungs are clear.  No swelling in her lower extremities.  Reviewed her x-ray which does not have any obvious findings by my read but radiology does comment on perihilar prominence which may indicate pulmonary edema.  Her EKG has a right axis deviation with some findings of RVH.  Patient's troponin is negative, she has a mild leukocytosis which she is had before.  Given her tachypnea with rather clear lungs we will send a D-dimer to screen for pulmonary embolism.  We will treat with a neb and also give her a dose of Lasix here.  She is mildly hypokalemic which may be in the setting of her frequent albuterol use at home.  We will supplement.  Patient refused repeat troponin and D-dimer.  I explained to her the potential that she could have a pulmonary embolism and she understood this risk and ultimately did not want to have any further evaluation.  Plan is to treat her with nebs and discharged with prednisone and potassium supplementation.      FINAL CLINICAL IMPRESSION(S) / ED DIAGNOSES   Final diagnoses:  Exacerbation of asthma, unspecified asthma severity, unspecified whether persistent  Hypokalemia     Rx / DC Orders   ED Discharge Orders          Ordered    potassium chloride SA (KLOR-CON M) 20 MEQ tablet  2 times daily        08/19/21 1650    albuterol (VENTOLIN HFA) 108 (90 Base) MCG/ACT inhaler  Every 6 hours PRN        08/19/21 1650    predniSONE (DELTASONE) 50 MG tablet        08/19/21 1650             Note:  This document was prepared using Dragon voice recognition software and may include unintentional dictation errors.   Georga Hacking, MD 08/19/21 Mikle Bosworth

## 2021-08-19 NOTE — Discharge Instructions (Addendum)
Please use your albuterol inhaler every 4 hours as needed.  Please take the prednisone 1 pill for the next 5 days.  Your potassium was mildly low, please take the supplement for the next 5 days.  Please see your doctor in about 1 week to have your blood work repeated.  If your shortness of breath is worsening, please return to the emergency department.

## 2021-08-19 NOTE — ED Notes (Signed)
Pt refusing repeat blood work, EDP Mchugh made aware.

## 2022-05-25 ENCOUNTER — Other Ambulatory Visit: Payer: Self-pay

## 2022-05-25 ENCOUNTER — Emergency Department: Payer: Medicaid Other

## 2022-05-25 ENCOUNTER — Emergency Department
Admission: EM | Admit: 2022-05-25 | Discharge: 2022-05-25 | Disposition: A | Discharge status: Home / Self Care | Payer: Medicaid Other | Attending: Emergency Medicine | Admitting: Emergency Medicine

## 2022-05-25 DIAGNOSIS — J45909 Unspecified asthma, uncomplicated: Secondary | ICD-10-CM | POA: Insufficient documentation

## 2022-05-25 DIAGNOSIS — I110 Hypertensive heart disease with heart failure: Secondary | ICD-10-CM | POA: Diagnosis not present

## 2022-05-25 DIAGNOSIS — R519 Headache, unspecified: Secondary | ICD-10-CM | POA: Diagnosis present

## 2022-05-25 DIAGNOSIS — I509 Heart failure, unspecified: Secondary | ICD-10-CM | POA: Diagnosis not present

## 2022-05-25 DIAGNOSIS — Z20822 Contact with and (suspected) exposure to covid-19: Secondary | ICD-10-CM | POA: Diagnosis not present

## 2022-05-25 DIAGNOSIS — J209 Acute bronchitis, unspecified: Secondary | ICD-10-CM

## 2022-05-25 LAB — CBC
HCT: 40.6 % (ref 36.0–46.0)
Hemoglobin: 12.9 g/dL (ref 12.0–15.0)
MCH: 26.9 pg (ref 26.0–34.0)
MCHC: 31.8 g/dL (ref 30.0–36.0)
MCV: 84.8 fL (ref 80.0–100.0)
Platelets: 381 10*3/uL (ref 150–400)
RBC: 4.79 MIL/uL (ref 3.87–5.11)
RDW: 15.7 % — ABNORMAL HIGH (ref 11.5–15.5)
WBC: 9.9 10*3/uL (ref 4.0–10.5)
nRBC: 0 % (ref 0.0–0.2)

## 2022-05-25 LAB — BASIC METABOLIC PANEL
Anion gap: 8 (ref 5–15)
BUN: 19 mg/dL (ref 6–20)
CO2: 25 mmol/L (ref 22–32)
Calcium: 9.6 mg/dL (ref 8.9–10.3)
Chloride: 106 mmol/L (ref 98–111)
Creatinine, Ser: 0.88 mg/dL (ref 0.44–1.00)
GFR, Estimated: >60 mL/min (ref >60)
Glucose, Bld: 87 mg/dL (ref 70–99)
Potassium: 4.3 mmol/L (ref 3.5–5.1)
Sodium: 139 mmol/L (ref 135–145)

## 2022-05-25 LAB — BRAIN NATRIURETIC PEPTIDE: B Natriuretic Peptide: 45.7 pg/mL (ref 0.0–100.0)

## 2022-05-25 LAB — RESP PANEL BY RT-PCR (FLU A&B, COVID) ARPGX2
Influenza A by PCR: NEGATIVE
Influenza B by PCR: NEGATIVE
SARS Coronavirus 2 by RT PCR: NEGATIVE

## 2022-05-25 LAB — TROPONIN I (HIGH SENSITIVITY): Troponin I (High Sensitivity): 4 ng/L (ref <18)

## 2022-05-25 MED ORDER — BUTALBITAL-APAP-CAFFEINE 50-325-40 MG PO TABS: 1.0000 | ORAL_TABLET | Freq: Three times a day (TID) | ORAL | 0 refills | Status: AC | PRN

## 2022-05-25 MED ORDER — ALBUTEROL SULFATE HFA 108 (90 BASE) MCG/ACT IN AERS
2.0000 | INHALATION_SPRAY | Freq: Four times a day (QID) | RESPIRATORY_TRACT | 2 refills | Status: AC | PRN
Start: 2022-05-25 — End: ?

## 2022-05-25 MED ORDER — BENZONATATE 100 MG PO CAPS: 100.0000 mg | ORAL_CAPSULE | Freq: Four times a day (QID) | ORAL | 0 refills | Status: DC | PRN

## 2022-05-25 MED ORDER — BUTALBITAL-APAP-CAFFEINE 50-325-40 MG PO TABS
2.0000 | ORAL_TABLET | ORAL | Status: AC
Start: 2022-05-25 — End: 2022-05-25
  Administered 2022-05-25: 2 via ORAL
  Filled 2022-05-25: qty 2

## 2022-05-25 MED ORDER — PREDNISONE 20 MG PO TABS
40.0000 mg | ORAL_TABLET | Freq: Once | ORAL | Status: AC
  Administered 2022-05-25: 40 mg via ORAL
  Filled 2022-05-25: qty 2

## 2022-05-25 MED ORDER — PREDNISONE 20 MG PO TABS
40.0000 mg | ORAL_TABLET | Freq: Every day | ORAL | 0 refills | Status: AC
Start: 2022-05-25 — End: 2022-05-29

## 2022-05-25 NOTE — ED Provider Notes (Signed)
[facility] Provider Note    Event Date/Time   First MD Initiated Contact with Patient 05/25/22 1104     (approximate)   History   Headache   HPI  Tiffany Hickman is a 41 y.o. female who has a history of hypertension prediabetes listed as prior stroke, back pain and asthma.  I spent some time reviewing her chart and I do not see clear evidence of a previous stroke.  Patient does relate though that she has a history of congestive heart failure  For the last 4 to 5 days patient has been experiencing some nasal congestion and a cough.  She has noticed the last few days when she lays down at night she seems to feel slightly short of breath.  There is no chest pain.  She has not had a fever but has felt chilled and as though she has developed a upper respiratory or "cold" the last couple days  No abdominal pain no nausea or vomiting.  Along with this she has had mild headache sometimes in the front right and occasionally across the back of the scalp.  Currently she reports it to be very mild.    I reviewed PERC PE risk factors with the patient, she is negative on all  She has not noticed any swelling in her legs.  She continues to take furosemide daily.  She does relate that she has not taken her medications yet this morning   She does report history of asthma, and has had to use her inhaler intermittently for the last few days due to wheezing.  It would go away after use of the inhaler, presently denies that she feels like she is wheezing.  Physical Exam   Triage Vital Signs: ED Triage Vitals  Enc Vitals Group     BP 05/25/22 1012 (!) 151/77     Pulse Rate 05/25/22 1012 84     Resp 05/25/22 1012 18     Temp 05/25/22 1012 98.4 F (36.9 C)     Temp Source 05/25/22 1012 Oral     SpO2 05/25/22 1012 100 %     Weight 05/25/22 1007 240 lb (108.9 kg)     Height 05/25/22 1007 5\' 6"  (1.676 m)     Head Circumference --      Peak Flow --      Pain Score  05/25/22 1006 0     Pain Loc --      Pain Edu? --      Excl. in GC? --     Most recent vital signs: Vitals:   05/25/22 1012  BP: (!) 151/77  Pulse: 84  Resp: 18  Temp: 98.4 F (36.9 C)  SpO2: 100%     General: Awake, no distress.  She is very pleasant.  Seated comfortably in chair CV:  Good peripheral perfusion.  Normal rate and heart tones Resp:  Normal effort.  Clear bilateral with normal work of breathing Abd:  No distention.  Soft nondistended Other:  No appreciable lower extremity edema.  No JVD.  Lung sounds are clear without crackles or rales.  She appears euvolemic by exam.  She moves all extremities well, ambulates without difficulty.  Cranial nerve exam is normal.  Extraocular movements are normal.  No facial droop.  No meningismus.   ED Results / Procedures / Treatments   Labs (all labs ordered are listed, but only abnormal results are displayed) Labs Reviewed  CBC - Abnormal; Notable for the  following components:      Result Value   RDW 15.7 (*)    All other components within normal limits  RESP PANEL BY RT-PCR (FLU A&B, COVID) ARPGX2  RESP PANEL BY RT-PCR (RSV, FLU A&B, COVID)  RVPGX2  BASIC METABOLIC PANEL  BRAIN NATRIURETIC PEPTIDE  POC URINE PREG, ED  TROPONIN I (HIGH SENSITIVITY)     EKG  And interpreted by me at 1010 heart rate 80 QRS 80 QTc 430 Normal sinus rhythm, no evidence of acute ischemia.  There is repolarization abnormality that is very mild, she has evidence of P wave enlargement over both atrial regions.  Comparison with previous EKG from February 13 with same morphology   RADIOLOGY  Chest x-ray is interpreted by me as negative for acute finding such as infiltrate pneumothorax or CHF   PROCEDURES:  Critical Care performed: No  Procedures   MEDICATIONS ORDERED IN ED: Medications  butalbital-acetaminophen-caffeine (FIORICET) 50-325-40 MG per tablet 2 tablet (2 tablets Oral Given 05/25/22 1131)  predniSONE (DELTASONE) tablet 40  mg (40 mg Oral Given 05/25/22 1137)     IMPRESSION / MDM / ASSESSMENT AND PLAN / ED COURSE  I reviewed the triage vital signs and the nursing notes.                              Differential diagnosis includes, but is not limited to, what appears to be likely mild upper respiratory infection versus perhaps a slight exacerbation of congestive heart failure though she does not appear to be hypervolemic on exam.  Her initial labs including CBC and metabolic panel are normal.  Her EKG without evidence of ischemia.  She has no risk factors for pulmonary embolism and is PERC negative.  Very reassuring clinical examination, I suspect based on the history that she may be having a small amount of bronchitis she reports some occasional intermittent wheezing that goes away with use of her inhaler at home.  Also start her on prednisone for short course.  If her BNP is elevated would consider having the patient increase her Lasix for the next few days, but it is normal I think I would keep her on her current regimen.  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  Patient's presentation is most consistent with acute complicated illness / injury requiring diagnostic workup.  ----------------------------------------- 12:29 PM on 05/25/2022 ----------------------------------------- BNP normal with euvolemic exam making CHF highly unlikely to be driving her cough.  Suspect likely mild viral illness  Covid test negative  ----------------------------------------- 1:21 PM on 05/25/2022 ----------------------------------------- Patient reports full relief of her headache.  She is resting comfortably.  She feels comfortable with plan to go home with careful return precautions advised.  She understands not to drive within 8 hours of use of Fioricet which I will provide to her as an as needed for headaches as well.  Return precautions and treatment recommendations and follow-up  discussed with the patient who is agreeable with the plan.  Her father is driving her home     FINAL CLINICAL IMPRESSION(S) / ED DIAGNOSES   Final diagnoses:  Acute bronchitis, unspecified organism     Rx / DC Orders   ED Discharge Orders          Ordered    predniSONE (DELTASONE) 20 MG tablet  Daily with breakfast        05/25/22 1230    benzonatate (TESSALON  PERLES) 100 MG capsule  Every 6 hours PRN        05/25/22 1230    albuterol (VENTOLIN HFA) 108 (90 Base) MCG/ACT inhaler  Every 6 hours PRN        05/25/22 1320    butalbital-acetaminophen-caffeine (FIORICET) 50-325-40 MG tablet  Every 8 hours PRN        05/25/22 1320             Note:  This document was prepared using Dragon voice recognition software and may include unintentional dictation errors.   Sharyn Creamer, MD 05/25/22 1321

## 2022-05-25 NOTE — ED Triage Notes (Signed)
Arrives with c/o headache x 3 days.  C/O right forehead pain.  Also c/o cough and being unable to lay flat to sleep x 2-3 days.  AAOx3.  Skin warm and dry. No SOB/ DOE.  NAD

## 2022-05-25 NOTE — Discharge Instructions (Addendum)
Do not drive this afternoon or within 8 hours of use of Fioricet.

## 2022-06-23 ENCOUNTER — Encounter: Payer: Self-pay | Admitting: Emergency Medicine

## 2022-06-23 ENCOUNTER — Emergency Department
Admission: EM | Admit: 2022-06-23 | Discharge: 2022-06-23 | Disposition: A | Discharge status: Home / Self Care | Payer: Medicaid Other | Attending: Student in an Organized Health Care Education/Training Program | Admitting: Student in an Organized Health Care Education/Training Program

## 2022-06-23 ENCOUNTER — Other Ambulatory Visit: Payer: Self-pay

## 2022-06-23 DIAGNOSIS — Z1152 Encounter for screening for COVID-19: Secondary | ICD-10-CM | POA: Diagnosis not present

## 2022-06-23 DIAGNOSIS — I110 Hypertensive heart disease with heart failure: Secondary | ICD-10-CM | POA: Insufficient documentation

## 2022-06-23 DIAGNOSIS — I509 Heart failure, unspecified: Secondary | ICD-10-CM | POA: Diagnosis not present

## 2022-06-23 DIAGNOSIS — J029 Acute pharyngitis, unspecified: Secondary | ICD-10-CM | POA: Diagnosis present

## 2022-06-23 DIAGNOSIS — J020 Streptococcal pharyngitis: Secondary | ICD-10-CM | POA: Diagnosis not present

## 2022-06-23 LAB — GROUP A STREP BY PCR: Group A Strep by PCR: DETECTED — AB

## 2022-06-23 LAB — RESP PANEL BY RT-PCR (RSV, FLU A&B, COVID)  RVPGX2
Influenza A by PCR: NEGATIVE
Influenza B by PCR: NEGATIVE
Resp Syncytial Virus by PCR: NEGATIVE
SARS Coronavirus 2 by RT PCR: NEGATIVE

## 2022-06-23 MED ORDER — AMOXICILLIN-POT CLAVULANATE 875-125 MG PO TABS: 1.0000 | ORAL_TABLET | Freq: Two times a day (BID) | ORAL | 0 refills | Status: AC

## 2022-06-23 NOTE — ED Triage Notes (Signed)
Pt sts that yesterday she started to have a sore throat. Per pt it is worse this AM.

## 2022-06-23 NOTE — ED Provider Notes (Signed)
[facility] Provider Note    Event Date/Time   First MD Initiated Contact with Patient 06/23/22 1505     (approximate)   History   Sore Throat   HPI  Tiffany Hickman is a 41 y.o. female who presents today for evaluation of sore throat since yesterday.  She reports that it felt worse today which is requiring her to come to the emergency department for evaluation.  She denies any voice change, difficulty swallowing, or drooling.  She does not think that she has had a fever.  She has not had any nausea or vomiting.  No chest pain or shortness of breath.  Unsure of any known sick contacts.  Patient Active Problem List   Diagnosis Date Noted   Chronic heart failure with preserved ejection fraction (HFpEF) (HCC) 02/24/2019   Mitral valve disease 02/24/2019   Essential hypertension 02/24/2019          Physical Exam   Triage Vital Signs: ED Triage Vitals  Enc Vitals Group     BP 06/23/22 1419 (!) 197/98     Pulse Rate 06/23/22 1419 (!) 106     Resp 06/23/22 1419 17     Temp 06/23/22 1419 99.1 F (37.3 C)     Temp Source 06/23/22 1419 Oral     SpO2 06/23/22 1419 99 %     Weight 06/23/22 1420 261 lb (118.4 kg)     Height 06/23/22 1441 5\' 6"  (1.676 m)     Head Circumference --      Peak Flow --      Pain Score 06/23/22 1420 8     Pain Loc --      Pain Edu? --      Excl. in GC? --     Most recent vital signs: Vitals:   06/23/22 1419  BP: (!) 197/98  Pulse: (!) 106  Resp: 17  Temp: 99.1 F (37.3 C)  SpO2: 99%    Physical Exam Vitals and nursing note reviewed.  Constitutional:      General: Awake and alert. No acute distress.    Appearance: Normal appearance. The patient is normal weight.  HENT:     Head: Normocephalic and atraumatic.     Mouth: Mucous membranes are moist. Uvula midline.  No tonsillar exudate.  No soft palate fluctuance.  No trismus.  No voice change.  No sublingual swelling.  No tender cervical lymphadenopathy.  No  nuchal rigidity Eyes:     General: PERRL. Normal EOMs        Right eye: No discharge.        Left eye: No discharge.     Conjunctiva/sclera: Conjunctivae normal.  Cardiovascular:     Rate and Rhythm: Normal rate and regular rhythm.     Pulses: Normal pulses.  Pulmonary:     Effort: Pulmonary effort is normal. No respiratory distress.     Breath sounds: Normal breath sounds.  Abdominal:     Abdomen is soft. There is no abdominal tenderness. No rebound or guarding. No distention. Musculoskeletal:        General: No swelling. Normal range of motion.     Cervical back: Normal range of motion and neck supple.  Skin:    General: Skin is warm and dry.     Capillary Refill: Capillary refill takes less than 2 seconds.     Findings: No rash.  Neurological:     Mental Status: The patient is awake and alert.  ED Results / Procedures / Treatments   Labs (all labs ordered are listed, but only abnormal results are displayed) Labs Reviewed  GROUP A STREP BY PCR - Abnormal; Notable for the following components:      Result Value   Group A Strep by PCR DETECTED (*)    All other components within normal limits  RESP PANEL BY RT-PCR (RSV, FLU A&B, COVID)  RVPGX2     EKG     RADIOLOGY     PROCEDURES:  Critical Care performed:   Procedures   MEDICATIONS ORDERED IN ED: Medications - No data to display   IMPRESSION / MDM / ASSESSMENT AND PLAN / ED COURSE  I reviewed the triage vital signs and the nursing notes.   Differential diagnosis includes, but is not limited to, strep pharyngitis, viral pharyngitis, COVID, influenza.  Patient is awake and alert, hemodynamically stable and afebrile.  No voice change, trismus, drooling, uvular deviation, neck pain or stiffness to suggest peritonsillar or retropharyngeal abscess.  Her strep test was positive for strep pharyngitis.  She was started on antibiotics.  Discussed the importance of completing the entire course of antibiotics  even if she begins to feel better.  Patient understands and agrees with plan.  She was discharged in stable condition.  She was given a work note as requested.   Patient's presentation is most consistent with acute complicated illness / injury requiring diagnostic workup.      FINAL CLINICAL IMPRESSION(S) / ED DIAGNOSES   Final diagnoses:  Strep pharyngitis     Rx / DC Orders   ED Discharge Orders          Ordered    amoxicillin-clavulanate (AUGMENTIN) 875-125 MG tablet  2 times daily        06/23/22 1507             Note:  This document was prepared using Dragon voice recognition software and may include unintentional dictation errors.   Keturah Shavers 06/23/22 1511    Willy Eddy, MD 06/23/22 1549

## 2022-06-23 NOTE — Discharge Instructions (Signed)
You have tested positive for strep throat.  Please take the antibiotic as prescribed.  Please return for any new, worsening, or change in symptoms or other concerns including trouble opening her mouth or swallowing.  It was a pleasure caring for you today.

## 2022-08-23 ENCOUNTER — Emergency Department
Admission: EM | Admit: 2022-08-23 | Discharge: 2022-08-23 | Disposition: A | Discharge status: Home / Self Care | Payer: Medicaid Other | Attending: Emergency Medicine | Admitting: Emergency Medicine

## 2022-08-23 ENCOUNTER — Emergency Department: Payer: Medicaid Other

## 2022-08-23 ENCOUNTER — Other Ambulatory Visit: Payer: Self-pay

## 2022-08-23 DIAGNOSIS — J029 Acute pharyngitis, unspecified: Secondary | ICD-10-CM

## 2022-08-23 DIAGNOSIS — J069 Acute upper respiratory infection, unspecified: Secondary | ICD-10-CM | POA: Diagnosis not present

## 2022-08-23 DIAGNOSIS — Z20822 Contact with and (suspected) exposure to covid-19: Secondary | ICD-10-CM | POA: Diagnosis not present

## 2022-08-23 DIAGNOSIS — R059 Cough, unspecified: Secondary | ICD-10-CM | POA: Diagnosis present

## 2022-08-23 DIAGNOSIS — I509 Heart failure, unspecified: Secondary | ICD-10-CM | POA: Insufficient documentation

## 2022-08-23 DIAGNOSIS — I110 Hypertensive heart disease with heart failure: Secondary | ICD-10-CM | POA: Insufficient documentation

## 2022-08-23 LAB — RESP PANEL BY RT-PCR (RSV, FLU A&B, COVID)  RVPGX2
Influenza A by PCR: NEGATIVE
Influenza B by PCR: NEGATIVE
Resp Syncytial Virus by PCR: NEGATIVE
SARS Coronavirus 2 by RT PCR: NEGATIVE

## 2022-08-23 LAB — GROUP A STREP BY PCR: Group A Strep by PCR: NOT DETECTED

## 2022-08-23 MED ORDER — ALBUTEROL SULFATE HFA 108 (90 BASE) MCG/ACT IN AERS: 2.0000 | INHALATION_SPRAY | Freq: Four times a day (QID) | RESPIRATORY_TRACT | 0 refills | Status: DC | PRN

## 2022-08-23 MED ORDER — ACETAMINOPHEN 325 MG PO TABS
650.0000 mg | ORAL_TABLET | Freq: Once | ORAL | Status: AC
  Administered 2022-08-23: 650 mg via ORAL
  Filled 2022-08-23: qty 2

## 2022-08-23 NOTE — ED Triage Notes (Signed)
Pt reports sore throat, bodyaches and feeling bad for 3 days. Pt requesting to be tested for flu and covid. Denies fevers.

## 2022-08-23 NOTE — ED Provider Notes (Signed)
[facility] Provider Note    Event Date/Time   First MD Initiated Contact with Patient 08/23/22 1041     (approximate)   History   Sore Throat and Generalized Body Aches   HPI  Tiffany Hickman is a 42 y.o. female who presents today for evaluation of cough, nasal congestion, body aches, and sore throat for the past couple of days.  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  She denies chest pain or shortness of breath.  No abdominal pain, nausea, vomiting, diarrhea.  Patient Active Problem List   Diagnosis Date Noted   Chronic heart failure with preserved ejection fraction (HFpEF) ([place]) 02/24/2019   Mitral valve disease 02/24/2019   Essential hypertension 02/24/2019          Physical Exam   Triage Vital Signs: ED Triage Vitals  Enc Vitals Group     BP 08/23/22 1025 (!) 156/86     Pulse Rate 08/23/22 1025 86     Resp 08/23/22 1025 17     Temp 08/23/22 1025 98.3 F (36.8 C)     Temp Source 08/23/22 1025 Oral     SpO2 08/23/22 1025 100 %     Weight 08/23/22 1016 237 lb (107.5 kg)     Height 08/23/22 1016 5' 6"$  (1.676 m)     Head Circumference --      Peak Flow --      Pain Score 08/23/22 1016 8     Pain Loc --      Pain Edu? --      Excl. in [place]? --     Most recent vital signs: Vitals:   08/23/22 1025  BP: (!) 156/86  Pulse: 86  Resp: 17  Temp: 98.3 F (36.8 C)  SpO2: 100%    Physical Exam Vitals and nursing note reviewed.  Constitutional:      General: Awake and alert. No acute distress.    Appearance: Normal appearance. The patient is obese.  HENT:     Head: Normocephalic and atraumatic.     Mouth: Mucous membranes are moist. Uvula midline.  Mild posterior oropharyngeal erythema.  No tonsillar exudate.  No soft palate fluctuance.  No trismus.  No voice change.  No sublingual swelling.  No tender cervical lymphadenopathy.  No nuchal rigidity Eyes:     General:  PERRL. Normal EOMs        Right eye: No discharge.        Left eye: No discharge.     Conjunctiva/sclera: Conjunctivae normal.  Cardiovascular:     Rate and Rhythm: Normal rate and regular rhythm.     Pulses: Normal pulses.  Pulmonary:     Effort: Pulmonary effort is normal. No respiratory distress.     Breath sounds: Normal breath sounds.  Able to speak easily in complete sentences Abdominal:     Abdomen is soft. There is no abdominal tenderness. No rebound or guarding. No distention. Musculoskeletal:        General: No swelling. Normal range of motion.     Cervical back: Normal range of motion and neck supple.  Skin:    General: Skin is warm and dry.     Capillary Refill: Capillary refill takes less than 2 seconds.     Findings: No rash.  Neurological:     Mental Status: The patient is awake and alert.      ED Results / Procedures /  Treatments   Labs (all labs ordered are listed, but only abnormal results are displayed) Labs Reviewed  RESP PANEL BY RT-PCR (RSV, FLU A&B, COVID)  RVPGX2  GROUP A STREP BY PCR     EKG     RADIOLOGY I independently reviewed and interpreted imaging and agree with radiologists findings.     PROCEDURES:  Critical Care performed:   Procedures   MEDICATIONS ORDERED IN ED: Medications  acetaminophen (TYLENOL) tablet 650 mg (650 mg Oral Given 08/23/22 1146)     IMPRESSION / MDM / ASSESSMENT AND PLAN / ED COURSE  I reviewed the triage vital signs and the nursing notes.   Differential diagnosis includes, but is not limited to, COVID, influenza, strep pharyngitis, RSV, other URI.  Patient is awake and alert, hemodynamically stable and afebrile.  She is nontoxic in appearance and is able to speak easily in complete sentences with a normal oxygen saturation of 100% on room air.  She does not appear to be volume overloaded, I do not suspect heart failure exacerbation at this time.  COVID, flu, RSV swab obtained in triage is negative.  I  subsequently added on a strep test and a chest x-ray given that patient reports that she has been using her inhaler a lot.  She has no wheezes on exam and she has a normal oxygen saturation of 100% on room air.  No chest pain, hemoptysis, no signs or symptoms of DVT to suggest PE as a source of her symptoms today.  Strep and x-ray are also reassuring.  Patient likely has a viral URI with her nasal congestion, sore throat, and cough.  She does not appear to be acutely ill.  We discussed symptomatic management and strict return precautions.  Patient understands and agrees with plan.  She was discharged in stable condition.   Patient's presentation is most consistent with acute complicated illness / injury requiring diagnostic workup.    FINAL CLINICAL IMPRESSION(S) / ED DIAGNOSES   Final diagnoses:  Upper respiratory tract infection, unspecified type  Pharyngitis, unspecified etiology     Rx / DC Orders   ED Discharge Orders          Ordered    albuterol (VENTOLIN HFA) 108 (90 Base) MCG/ACT inhaler  Every 6 hours PRN        08/23/22 1218             Note:  This document was prepared using Dragon voice recognition software and may include unintentional dictation errors.   Marquette Old, PA-C 08/23/22 1320    Nathaniel Man, MD 08/24/22 [phone]

## 2022-08-23 NOTE — Discharge Instructions (Signed)
Your strep, flu, RSV, COVID swabs were negative.  You may continue to take Tylenol/ibuprofen per package instructions to help with your symptoms.  Your inhaler was also refilled for you.  Please return for any new, worsening, or change in symptoms or other concerns.  It was a pleasure caring for you today.

## 2022-10-14 ENCOUNTER — Other Ambulatory Visit: Payer: Self-pay | Admitting: Family Medicine

## 2022-10-14 DIAGNOSIS — Z1231 Encounter for screening mammogram for malignant neoplasm of breast: Secondary | ICD-10-CM

## 2022-10-15 ENCOUNTER — Emergency Department
Admission: EM | Admit: 2022-10-15 | Discharge: 2022-10-16 | Disposition: A | Discharge status: Home / Self Care | Payer: No Typology Code available for payment source | Attending: Emergency Medicine | Admitting: Emergency Medicine

## 2022-10-15 ENCOUNTER — Other Ambulatory Visit: Payer: Self-pay

## 2022-10-15 DIAGNOSIS — F410 Panic disorder [episodic paroxysmal anxiety] without agoraphobia: Secondary | ICD-10-CM | POA: Insufficient documentation
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
## 2022-10-15 NOTE — ED Notes (Signed)
This tech walked into triage rm 1 and found pt sleeping. I woke pt up, introduced self to pt and explained to pt that I was going to draw some blood. Pt stated, "I do not want to have blood taken. I want to go home." Pt made aware that she would have to find a ride home d/t her being intoxicated. Eliberto Ivory, RN made aware. Pt wheeled out to the lobby.

## 2022-10-16 NOTE — ED Notes (Signed)
Pt went to the bathroom independently and came back to bed. Katrinka Blazing, MD speaking with patient at this time. Pt is awake and alert.

## 2022-10-16 NOTE — ED Notes (Signed)
Pt actively refusing blood draw and any additional work up at this time, states she wishes to go home. Pt called daughter to come pick her up. Pt is drowsy but responds to voice, A&Ox4. Breathing is even and unlabored. No acute distress noted.
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
## 2022-11-21 ENCOUNTER — Ambulatory Visit (LOCAL_COMMUNITY_HEALTH_CENTER): Payer: Self-pay

## 2022-11-21 DIAGNOSIS — Z111 Encounter for screening for respiratory tuberculosis: Secondary | ICD-10-CM

## 2022-11-24 ENCOUNTER — Ambulatory Visit (LOCAL_COMMUNITY_HEALTH_CENTER): Payer: Self-pay

## 2022-11-24 DIAGNOSIS — Z111 Encounter for screening for respiratory tuberculosis: Secondary | ICD-10-CM

## 2022-11-24 LAB — TB SKIN TEST
Induration: 0 mm
TB Skin Test: NEGATIVE

## 2022-12-15 ENCOUNTER — Ambulatory Visit: Payer: Medicaid Other | Attending: Cardiology | Admitting: Cardiology

## 2022-12-15 ENCOUNTER — Encounter: Payer: Self-pay | Admitting: Cardiology

## 2022-12-15 VITALS — BP 116/76 | HR 67 | Ht 66.0 in | Wt 254.2 lb

## 2022-12-15 DIAGNOSIS — I050 Rheumatic mitral stenosis: Secondary | ICD-10-CM | POA: Insufficient documentation

## 2022-12-15 DIAGNOSIS — I10 Essential (primary) hypertension: Secondary | ICD-10-CM | POA: Diagnosis present

## 2022-12-15 MED ORDER — FUROSEMIDE 20 MG PO TABS: 20.0000 mg | ORAL_TABLET | Freq: Two times a day (BID) | ORAL | 3 refills | Status: DC

## 2022-12-15 MED ORDER — POTASSIUM CHLORIDE CRYS ER 20 MEQ PO TBCR: 20.0000 meq | EXTENDED_RELEASE_TABLET | Freq: Every day | ORAL | 3 refills | Status: AC

## 2022-12-15 MED ORDER — LOSARTAN POTASSIUM 25 MG PO TABS: 25.0000 mg | ORAL_TABLET | Freq: Every day | ORAL | 3 refills | Status: DC

## 2022-12-15 MED ORDER — METOPROLOL SUCCINATE ER 25 MG PO TB24: 25.0000 mg | ORAL_TABLET | Freq: Every day | ORAL | 3 refills | Status: AC

## 2022-12-15 NOTE — Patient Instructions (Signed)
Medication Instructions:   Your physician recommends that you continue on your current medications as directed. Please refer to the Current Medication list given to you today.  *If you need a refill on your cardiac medications before your next appointment, please call your pharmacy*   Lab Work:  Your physician recommends you go to the medical mall for labs - BMP  If you have labs (blood work) drawn today and your tests are completely normal, you will receive your results only by: MyChart Message (if you have MyChart) OR A paper copy in the mail If you have any lab test that is abnormal or we need to change your treatment, we will call you to review the results.   Testing/Procedures:  Your physician has requested that you have an echocardiogram. Echocardiography is a painless test that uses sound waves to create images of your heart. It provides your doctor with information about the size and shape of your heart and how well your heart's chambers and valves are working. This procedure takes approximately one hour. There are no restrictions for this procedure. Please do NOT wear cologne, perfume, aftershave, or lotions (deodorant is allowed). Please arrive 15 minutes prior to your appointment time.   Follow-Up: At [facility], you and your health needs are our priority.  As part of our continuing mission to provide you with exceptional heart care, we have created designated Provider Care Teams.  These Care Teams include your primary Cardiologist (physician) and Advanced Practice Providers (APPs -  Physician Assistants and Nurse Practitioners) who all work together to provide you with the care you need, when you need it.  We recommend signing up for the patient portal called "MyChart".  Sign up information is provided on this After Visit Summary.  MyChart is used to connect with patients for Virtual Visits (Telemedicine).  Patients are able to view lab/test results, encounter notes,  upcoming appointments, etc.  Non-urgent messages can be sent to your provider as well.   To learn more about what you can do with MyChart, go to [email or web address].    Your next appointment:    After Echocardiogram  Provider:   You may see Debbe Odea, MD or one of the following Advanced Practice Providers on your designated Care Team:   Nicolasa Ducking, NP Eula Listen, PA-C Cadence Fransico Michael, PA-C Charlsie Quest, NP

## 2022-12-15 NOTE — Progress Notes (Signed)
Cardiology Office Note:    Date:  12/15/2022   ID:  Tiffany Hickman, DOB 1980-12-29, MRN [id number]  PCP:  [facility], [facility]   [facility] Providers Cardiologist:  Debbe Odea, MD     Referring MD: [facility], [facility]*   Chief Complaint  Patient presents with   New Patient (Initial Visit)    Previously seen by Dr. Okey Dupre in 2020.  Patient is having worsening dyspnea on exertion.      History of Present Illness:    Tiffany Hickman is a 42 y.o. female with a hx of hypertension, mitral stenosis presenting with shortness of breath.  Last seen in our practice 2020 due to symptoms of shortness of breath, at the time echocardiogram showed normal systolic function with moderate to severe mitral valve stenosis.  Was started on Toprol-XL, Lasix  at the time.  States having shortness of breath with exertion which is slightly worse from prior.  She takes Lasix 20 mg twice daily, endorses adequate diuresis, shortness of breath usually improves after she gets some fluid off.  Denies chest pain.  Endorses elevated heart rates when she exerts or overexerts herself.   Past Medical History:  Diagnosis Date   Abscess, peritonsillar    Asthma    Back pain    Chronic diastolic heart failure (HCC)    Cubital tunnel syndrome    DOE (dyspnea on exertion)    Hidradenitis suppurativa    Hypertension    Hypertension    Major depressive disorder, recurrent episode, moderate (HCC)    Mitral stenosis    Patellar tendinitis    Prediabetes    Stroke (HCC)    Unspecified disorder of binocular vision     Past Surgical History:  Procedure Laterality Date   SKIN GRAFT     LEFT FOOT    Current Medications: Current Meds  Medication Sig   albuterol (VENTOLIN HFA) 108 (90 Base) MCG/ACT inhaler Inhale 2 puffs into the lungs every 6 (six) hours as needed for wheezing or shortness of breath.   Vitamin D, Ergocalciferol, (DRISDOL) 1.25 MG (50000 UNIT) CAPS  capsule Take 50,000 Units by mouth once a week.   [DISCONTINUED] furosemide (LASIX) 20 MG tablet Take 1 tablet (20 mg total) by mouth daily as needed for up to 20 days for edema (and shortness of breath.).   [DISCONTINUED] losartan (COZAAR) 25 MG tablet Take 25 mg by mouth daily.   [DISCONTINUED] metoprolol succinate (TOPROL XL) 25 MG 24 hr tablet Take 1 tablet (25 mg total) by mouth daily.   [DISCONTINUED] potassium chloride SA (KLOR-CON M) 20 MEQ tablet Take 1 tablet (20 mEq total) by mouth 2 (two) times daily for 5 days.     Allergies:   Patient has no known allergies.   Social History   Socioeconomic History   Marital status: Single    Spouse name: Not on file   Number of children: Not on file   Years of education: Not on file   Highest education level: Not on file  Occupational History   Not on file  Tobacco Use   Smoking status: Former    Years: 1    Types: Cigarettes   Smokeless tobacco: Never  Vaping Use   Vaping Use: Never used  Substance and Sexual Activity   Alcohol use: Not Currently   Drug use: No   Sexual activity: Not on file  Other Topics Concern   Not on file  Social History Narrative   Not  on file   Social Determinants of Health   Financial Resource Strain: Not on file  Food Insecurity: Not on file  Transportation Needs: Not on file  Physical Activity: Not on file  Stress: Not on file  Social Connections: Not on file     Family History: The patient's family history includes COPD in her father; Diabetes in her mother; Hypertension in her father and mother.  ROS:   Please see the history of present illness.     All other systems reviewed and are negative.  EKGs/Labs/Other Studies Reviewed:    The following studies were reviewed today:   EKG:  EKG is  ordered today.  The ekg ordered today demonstrates normal sinus rhythm, biatrial enlargement.  Recent Labs: 05/25/2022: B Natriuretic Peptide 45.7; BUN 19; Creatinine, Ser 0.88; Hemoglobin 12.9;  Platelets 381; Potassium 4.3; Sodium 139  Recent Lipid Panel No results found for: "CHOL", "TRIG", "HDL", "CHOLHDL", "VLDL", "LDLCALC", "LDLDIRECT"   Risk Assessment/Calculations:             Physical Exam:    VS:  BP 116/76 (BP Location: Left Arm, Patient Position: Sitting, Cuff Size: Large)   Pulse 67   Ht 5\' 6"  (1.676 m)   Wt 254 lb 3.2 oz (115.3 kg)   LMP 11/03/2022 (Within Days)   SpO2 99%   BMI 41.03 kg/m     Wt Readings from Last 3 Encounters:  12/15/22 254 lb 3.2 oz (115.3 kg)  10/15/22 235 lb 14.3 oz (107 kg)  08/23/22 237 lb (107.5 kg)     GEN:  Well nourished, well developed in no acute distress HEENT: Normal NECK: No JVD; No carotid bruits CARDIAC: RRR, no murmurs, rubs, gallops RESPIRATORY:  Clear to auscultation without rales, wheezing or rhonchi  ABDOMEN: Soft, non-tender, non-distended MUSCULOSKELETAL:  No edema; No deformity  SKIN: Warm and dry NEUROLOGIC:  Alert and oriented x 3 PSYCHIATRIC:  Normal affect   ASSESSMENT:    1. Mitral valve stenosis, unspecified etiology   2. Primary hypertension    PLAN:    In order of problems listed above:  Mitral valve stenosis, prior echo 2020 with moderate to severe mitral valve stenosis.  Likely reason for shortness of breath.  Continue Toprol-XL 20 mg daily, continue Lasix 20 mg twice daily.  Obtain BMP.  Obtain echocardiogram.  Plan referral to surgical team if severe stenosis confirmed. Hypertension, BP controlled, continue Toprol-XL 25 mg daily.      Follow-up after echo  Medication Adjustments/Labs and Tests Ordered: Current medicines are reviewed at length with the patient today.  Concerns regarding medicines are outlined above.  Orders Placed This Encounter  Procedures   EKG 12-Lead   ECHOCARDIOGRAM COMPLETE   Meds ordered this encounter  Medications   potassium chloride SA (KLOR-CON M) 20 MEQ tablet    Sig: Take 1 tablet (20 mEq total) by mouth daily.    Dispense:  90 tablet    Refill:   3   furosemide (LASIX) 20 MG tablet    Sig: Take 1 tablet (20 mg total) by mouth 2 (two) times daily.    Dispense:  180 tablet    Refill:  3   metoprolol succinate (TOPROL XL) 25 MG 24 hr tablet    Sig: Take 1 tablet (25 mg total) by mouth daily.    Dispense:  90 tablet    Refill:  3   losartan (COZAAR) 25 MG tablet    Sig: Take 1 tablet (25 mg total) by mouth daily.  Dispense:  90 tablet    Refill:  3    Patient Instructions  Medication Instructions:   Your physician recommends that you continue on your current medications as directed. Please refer to the Current Medication list given to you today.  *If you need a refill on your cardiac medications before your next appointment, please call your pharmacy*   Lab Work:  Your physician recommends you go to the medical mall for labs - BMP  If you have labs (blood work) drawn today and your tests are completely normal, you will receive your results only by: MyChart Message (if you have MyChart) OR A paper copy in the mail If you have any lab test that is abnormal or we need to change your treatment, we will call you to review the results.   Testing/Procedures:  Your physician has requested that you have an echocardiogram. Echocardiography is a painless test that uses sound waves to create images of your heart. It provides your doctor with information about the size and shape of your heart and how well your heart's chambers and valves are working. This procedure takes approximately one hour. There are no restrictions for this procedure. Please do NOT wear cologne, perfume, aftershave, or lotions (deodorant is allowed). Please arrive 15 minutes prior to your appointment time.   Follow-Up: At [facility], you and your health needs are our priority.  As part of our continuing mission to provide you with exceptional heart care, we have created designated Provider Care Teams.  These Care Teams include your primary  Cardiologist (physician) and Advanced Practice Providers (APPs -  Physician Assistants and Nurse Practitioners) who all work together to provide you with the care you need, when you need it.  We recommend signing up for the patient portal called "MyChart".  Sign up information is provided on this After Visit Summary.  MyChart is used to connect with patients for Virtual Visits (Telemedicine).  Patients are able to view lab/test results, encounter notes, upcoming appointments, etc.  Non-urgent messages can be sent to your provider as well.   To learn more about what you can do with MyChart, go to [email or web address].    Your next appointment:    After Echocardiogram  Provider:   You may see Debbe Odea, MD or one of the following Advanced Practice Providers on your designated Care Team:   Nicolasa Ducking, NP Eula Listen, PA-C Cadence Fransico Michael, PA-C Charlsie Quest, NP   Signed, Debbe Odea, MD  12/15/2022 10:54 AM    [facility]

## 2022-12-22 ENCOUNTER — Ambulatory Visit: Payer: Medicaid Other | Attending: Cardiology

## 2022-12-22 DIAGNOSIS — I050 Rheumatic mitral stenosis: Secondary | ICD-10-CM | POA: Diagnosis present

## 2022-12-22 LAB — ECHOCARDIOGRAM COMPLETE
Area-P 1/2: 3.12 cm2
S' Lateral: 2.7 cm

## 2022-12-23 ENCOUNTER — Other Ambulatory Visit: Payer: Self-pay

## 2022-12-23 DIAGNOSIS — I050 Rheumatic mitral stenosis: Secondary | ICD-10-CM

## 2022-12-26 ENCOUNTER — Ambulatory Visit: Payer: Medicaid Other | Attending: Medical | Admitting: Medical

## 2022-12-26 ENCOUNTER — Encounter: Payer: Self-pay | Admitting: Medical

## 2022-12-26 VITALS — BP 136/84 | HR 81 | Ht 66.0 in | Wt 258.0 lb

## 2022-12-26 DIAGNOSIS — I059 Rheumatic mitral valve disease, unspecified: Secondary | ICD-10-CM | POA: Insufficient documentation

## 2022-12-26 DIAGNOSIS — I10 Essential (primary) hypertension: Secondary | ICD-10-CM | POA: Diagnosis not present

## 2022-12-26 DIAGNOSIS — Z79899 Other long term (current) drug therapy: Secondary | ICD-10-CM | POA: Diagnosis not present

## 2022-12-26 NOTE — Patient Instructions (Addendum)
Medication Instructions:  Your Physician recommend you continue on your current medication as directed.    *If you need a refill on your cardiac medications before your next appointment, please call your pharmacy*   Lab Work: Your provider would like for you to have following labs drawn: CBC and BMET.   Please go to the [facility] entrance and check in at the front desk.  You do not need an appointment.  They are open from 7am-6 pm.   If you have labs (blood work) drawn today and your tests are completely normal, you will receive your results only by: MyChart Message (if you have MyChart) OR A paper copy in the mail If you have any lab test that is abnormal or we need to change your treatment, we will call you to review the results.   Testing/Procedures: You are scheduled for a TEE on January 01, 2023 with Dr. Azucena Cecil.    Please arrive at the [facility] Entrance of [facility], [address], [place] [address] at 07:30 am (This is 1 hour prior to your procedure time).  Proceed to the Check-In Desk directly inside the entrance.  Procedure Parking: Use the entrance off of the [address] side of the hospital. Turn right upon entering and follow the driveway to parking that is directly in front of the Heart & Vascular Center. There is no valet parking available at this entrance, however there is an awning directly in front of the Heart & Vascular Center for drop off/ pick up for patients  DIET: Nothing to eat or drink after midnight except a sip of water with medications (see medication instructions below)  Medication Instructions: Hold Metformin and Lasix the morning of your procedure.   FYI: For your safety, and to allow [place] to monitor your vital signs accurately during the surgery/procedure we request that if you have artificial nails, gel coating, SNS etc. Please have those removed prior to your surgery/procedure. Not having the nail coverings /polish removed  may result in cancellation or delay of your surgery/procedure.  You must have a responsible person to drive you home and stay in the waiting area during your procedure. Failure to do so could result in cancellation.  Bring your insurance cards.  If you have any questions after you get home, please call the office at [phone]  *Special Note: Every effort is made to have your procedure done on time. Occasionally there are emergencies that occur at the hospital that may cause delays. Please be patient if a delay does occur.        Follow-Up: At [facility], you and your health needs are our priority.  As part of our continuing mission to provide you with exceptional heart care, we have created designated Provider Care Teams.  These Care Teams include your primary Cardiologist (physician) and Advanced Practice Providers (APPs -  Physician Assistants and Nurse Practitioners) who all work together to provide you with the care you need, when you need it.  We recommend signing up for the patient portal called "MyChart".  Sign up information is provided on this After Visit Summary.  MyChart is used to connect with patients for Virtual Visits (Telemedicine).  Patients are able to view lab/test results, encounter notes, upcoming appointments, etc.  Non-urgent messages can be sent to your provider as well.   To learn more about what you can do with MyChart, go to [email or web address].    Your next appointment:  3 month(s)  Provider:   You may see Debbe Odea, MD or one of the following Advanced Practice Providers on your designated Care Team:   Nicolasa Ducking, NP Eula Listen, PA-C Cadence Fransico Michael, PA-C Charlsie Quest, NP     OTHER:

## 2022-12-26 NOTE — H&P (View-Only) (Signed)
Cardiology Office Note:    Date:  12/26/2022   ID:  Tiffany Hickman, DOB 1980/09/12, MRN [id number]  PCP:  [facility]  [facility] Cardiologist:  Debbe Odea, MD  [facility] Electrophysiologist:  None   Referring MD: [facility]   Chief Complaint: 3 week follow-up  History of Present Illness:    Tiffany Hickman is a 42 y.o. female with a hx of HTN, mitral stenosis who presents for 3 week follow-up.   Patient was seen 12/15/22 for SOB. Echo at that time showed normal LVSF with moderate to severe mitral valve stenosis, and was started on Toprol and lasix.   The patient was last seen 12/15/22 and reported DOE. Echo was ordered. This showed LVEF 60-65%, no WMA, G2DD, severely dilated LA, mild MR, mod to severe MS, mean gradient 13.26mmHg.   Today, the echo was reviewed. She is having DOE that is unchanged. No chest pain. No lower leg edema.  Mitral valve disease, work-up and treatment discussed at length today.  Past Medical History:  Diagnosis Date   Abscess, peritonsillar    Asthma    Back pain    Chronic diastolic heart failure (HCC)    Cubital tunnel syndrome    DOE (dyspnea on exertion)    Hidradenitis suppurativa    Hypertension    Hypertension    Major depressive disorder, recurrent episode, moderate (HCC)    Mitral stenosis    Patellar tendinitis    Prediabetes    Stroke (HCC)    Unspecified disorder of binocular vision     Past Surgical History:  Procedure Laterality Date   SKIN GRAFT     LEFT FOOT    Current Medications: Current Meds  Medication Sig   albuterol (VENTOLIN HFA) 108 (90 Base) MCG/ACT inhaler Inhale 2 puffs into the lungs every 6 (six) hours as needed for wheezing or shortness of breath.   furosemide (LASIX) 20 MG tablet Take 1 tablet (20 mg total) by mouth 2 (two) times daily.   losartan (COZAAR) 25 MG tablet Take 1 tablet (25 mg total) by mouth daily.   metoprolol succinate (TOPROL XL)  25 MG 24 hr tablet Take 1 tablet (25 mg total) by mouth daily.   potassium chloride SA (KLOR-CON M) 20 MEQ tablet Take 1 tablet (20 mEq total) by mouth daily.   Vitamin D, Ergocalciferol, (DRISDOL) 1.25 MG (50000 UNIT) CAPS capsule Take 50,000 Units by mouth once a week.     Allergies:   Patient has no known allergies.   Social History   Socioeconomic History   Marital status: Single    Spouse name: Not on file   Number of children: Not on file   Years of education: Not on file   Highest education level: Not on file  Occupational History   Not on file  Tobacco Use   Smoking status: Former    Years: 1    Types: Cigarettes   Smokeless tobacco: Never  Vaping Use   Vaping Use: Never used  Substance and Sexual Activity   Alcohol use: Not Currently   Drug use: No   Sexual activity: Not on file  Other Topics Concern   Not on file  Social History Narrative   Not on file   Social Determinants of Health   Financial Resource Strain: Not on file  Food Insecurity: Not on file  Transportation Needs: Not on file  Physical Activity: Not on file  Stress: Not on file  Social  Connections: Not on file     Family History: The patient's family history includes COPD in her father; Diabetes in her mother; Hypertension in her father and mother.  ROS:   Please see the history of present illness.    3 All other systems reviewed and are negative.  EKGs/Labs/Other Studies Reviewed:    The following studies were reviewed today:  Echo 12/2022 1. Left ventricular ejection fraction, by estimation, is 60 to 65%. The  left ventricle has normal function. The left ventricle has no regional  wall motion abnormalities. Left ventricular diastolic parameters are  consistent with Grade II diastolic  dysfunction (pseudonormalization).   2. Right ventricular systolic function is normal. The right ventricular  size is normal.   3. Left atrial size was severely dilated.   4. P1/2time = , MVA  (via pressure 1/2 time)= 2.0 cm2. The mitral  valve is rheumatic. Mild mitral valve regurgitation. Moderate to severe  mitral stenosis. The mean mitral valve gradient is 13.5 mmHg.   5. The aortic valve is normal in structure. Aortic valve regurgitation is  not visualized.   6. The inferior vena cava is normal in size with greater than 50%  respiratory variability, suggesting right atrial pressure of 3 mmHg.   Echo 02/2019  1. The left ventricle has normal systolic function, with an ejection  fraction of 55-60%. The cavity size was normal. There is mild concentric  left ventricular hypertrophy. Left ventricular diastolic Doppler  parameters are consistent with  pseudonormalization.   2. The right ventricle has normal systolic function. The cavity was  normal. There is no increase in right ventricular wall thickness. Right  ventricular systolic pressure could not be assessed.   3. Left atrial size was mildly dilated.   4. The mitral valve is rheumatic. Moederate to severe mitral valve  stenosis. Mean gradient is 14 mm Hg. Valve area by VTI was 2 and by  Plenimetry was 1.78. Mitral valve regurgitation is mild to moderate by  color flow Doppler.   5. The tricuspid valve is grossly normal.   6. No stenosis of the aortic valve.   7. The aorta is normal in size and structure.   EKG:  EKG is not ordered today.    Recent Labs: 05/25/2022: B Natriuretic Peptide 45.7; BUN 19; Creatinine, Ser 0.88; Hemoglobin 12.9; Platelets 381; Potassium 4.3; Sodium 139  Recent Lipid Panel No results found for: "CHOL", "TRIG", "HDL", "CHOLHDL", "VLDL", "LDLCALC", "LDLDIRECT"   Physical Exam:    VS:  BP 136/84 (BP Location: Left Arm, Patient Position: Sitting, Cuff Size: Large)   Pulse 81   Ht 5\' 6"  (1.676 m)   Wt 258 lb (117 kg)   SpO2 100%   BMI 41.64 kg/m     Wt Readings from Last 3 Encounters:  12/26/22 258 lb (117 kg)  12/15/22 254 lb 3.2 oz (115.3 kg)  10/15/22 235 lb 14.3 oz (107 kg)      GEN:  Well nourished, well developed in no acute distress HEENT: Normal NECK: No JVD; No carotid bruits LYMPHATICS: No lymphadenopathy CARDIAC: RRR, +murmur, rubs, gallops RESPIRATORY:  Clear to auscultation without rales, wheezing or rhonchi  ABDOMEN: Soft, non-tender, non-distended MUSCULOSKELETAL:  No edema; No deformity  SKIN: Warm and dry NEUROLOGIC:  Alert and oriented x 3 PSYCHIATRIC:  Normal affect   ASSESSMENT:    1. Mitral valve disease   2. Medication management   3. Essential hypertension    PLAN:    In order of problems  listed above:  Mitral valve stenosis HFpEF Recent echo showed LVEF 60-65%, G2DD, moderate-severe mitral stenosis, gradient 13.20mmHg. Patient reports persistent DOE. Long discussion about mitral valve stenosis, work-up and treatment. I will order a TEE and refer patient to structural heart team. She will likely need a heart cath. Patient is euvolemic on exam The current medical regimen is effective;  continue present plan and medications, Lasix 20mg  BID and Toprol 25mg  daily.   HTN BP is good today, continue Toprol 25mg  daily.   Disposition: Follow up in 3 month(s) with Md/APP   Shared Decision Making/Informed Consent   Informed Consent   Shared Decision Making/Informed Consent The risks [esophageal damage, perforation (1:10,000 risk), bleeding, pharyngeal hematoma as well as other potential complications associated with conscious sedation including aspiration, arrhythmia, respiratory failure and death], benefits (treatment guidance and diagnostic support) and alternatives of a transesophageal echocardiogram were discussed in detail with Tiffany Hickman and she is willing to proceed.        Signed, Nathanie Ottley David Stall, PA-C  12/26/2022 4:15 PM    [facility]

## 2022-12-26 NOTE — Progress Notes (Unsigned)
Cardiology Office Note:    Date:  12/26/2022   ID:  TWISHA Hickman, DOB 1980/09/12, MRN [id number]  PCP:  [facility]  [facility] Cardiologist:  Debbe Odea, MD  [facility] Electrophysiologist:  None   Referring MD: [facility]   Chief Complaint: 3 week follow-up  History of Present Illness:    Tiffany Hickman is a 42 y.o. female with a hx of HTN, mitral stenosis who presents for 3 week follow-up.   Patient was seen 12/15/22 for SOB. Echo at that time showed normal LVSF with moderate to severe mitral valve stenosis, and was started on Toprol and lasix.   The patient was last seen 12/15/22 and reported DOE. Echo was ordered. This showed LVEF 60-65%, no WMA, G2DD, severely dilated LA, mild MR, mod to severe MS, mean gradient 13.26mmHg.   Today, the echo was reviewed. She is having DOE that is unchanged. No chest pain. No lower leg edema.  Mitral valve disease, work-up and treatment discussed at length today.  Past Medical History:  Diagnosis Date   Abscess, peritonsillar    Asthma    Back pain    Chronic diastolic heart failure (HCC)    Cubital tunnel syndrome    DOE (dyspnea on exertion)    Hidradenitis suppurativa    Hypertension    Hypertension    Major depressive disorder, recurrent episode, moderate (HCC)    Mitral stenosis    Patellar tendinitis    Prediabetes    Stroke (HCC)    Unspecified disorder of binocular vision     Past Surgical History:  Procedure Laterality Date   SKIN GRAFT     LEFT FOOT    Current Medications: Current Meds  Medication Sig   albuterol (VENTOLIN HFA) 108 (90 Base) MCG/ACT inhaler Inhale 2 puffs into the lungs every 6 (six) hours as needed for wheezing or shortness of breath.   furosemide (LASIX) 20 MG tablet Take 1 tablet (20 mg total) by mouth 2 (two) times daily.   losartan (COZAAR) 25 MG tablet Take 1 tablet (25 mg total) by mouth daily.   metoprolol succinate (TOPROL XL)  25 MG 24 hr tablet Take 1 tablet (25 mg total) by mouth daily.   potassium chloride SA (KLOR-CON M) 20 MEQ tablet Take 1 tablet (20 mEq total) by mouth daily.   Vitamin D, Ergocalciferol, (DRISDOL) 1.25 MG (50000 UNIT) CAPS capsule Take 50,000 Units by mouth once a week.     Allergies:   Patient has no known allergies.   Social History   Socioeconomic History   Marital status: Single    Spouse name: Not on file   Number of children: Not on file   Years of education: Not on file   Highest education level: Not on file  Occupational History   Not on file  Tobacco Use   Smoking status: Former    Years: 1    Types: Cigarettes   Smokeless tobacco: Never  Vaping Use   Vaping Use: Never used  Substance and Sexual Activity   Alcohol use: Not Currently   Drug use: No   Sexual activity: Not on file  Other Topics Concern   Not on file  Social History Narrative   Not on file   Social Determinants of Health   Financial Resource Strain: Not on file  Food Insecurity: Not on file  Transportation Needs: Not on file  Physical Activity: Not on file  Stress: Not on file  Social  Connections: Not on file     Family History: The patient's family history includes COPD in her father; Diabetes in her mother; Hypertension in her father and mother.  ROS:   Please see the history of present illness.    3 All other systems reviewed and are negative.  EKGs/Labs/Other Studies Reviewed:    The following studies were reviewed today:  Echo 12/2022 1. Left ventricular ejection fraction, by estimation, is 60 to 65%. The  left ventricle has normal function. The left ventricle has no regional  wall motion abnormalities. Left ventricular diastolic parameters are  consistent with Grade II diastolic  dysfunction (pseudonormalization).   2. Right ventricular systolic function is normal. The right ventricular  size is normal.   3. Left atrial size was severely dilated.   4. P1/2time = , MVA  (via pressure 1/2 time)= 2.0 cm2. The mitral  valve is rheumatic. Mild mitral valve regurgitation. Moderate to severe  mitral stenosis. The mean mitral valve gradient is 13.5 mmHg.   5. The aortic valve is normal in structure. Aortic valve regurgitation is  not visualized.   6. The inferior vena cava is normal in size with greater than 50%  respiratory variability, suggesting right atrial pressure of 3 mmHg.   Echo 02/2019  1. The left ventricle has normal systolic function, with an ejection  fraction of 55-60%. The cavity size was normal. There is mild concentric  left ventricular hypertrophy. Left ventricular diastolic Doppler  parameters are consistent with  pseudonormalization.   2. The right ventricle has normal systolic function. The cavity was  normal. There is no increase in right ventricular wall thickness. Right  ventricular systolic pressure could not be assessed.   3. Left atrial size was mildly dilated.   4. The mitral valve is rheumatic. Moederate to severe mitral valve  stenosis. Mean gradient is 14 mm Hg. Valve area by VTI was 2 and by  Plenimetry was 1.78. Mitral valve regurgitation is mild to moderate by  color flow Doppler.   5. The tricuspid valve is grossly normal.   6. No stenosis of the aortic valve.   7. The aorta is normal in size and structure.   EKG:  EKG is not ordered today.    Recent Labs: 05/25/2022: B Natriuretic Peptide 45.7; BUN 19; Creatinine, Ser 0.88; Hemoglobin 12.9; Platelets 381; Potassium 4.3; Sodium 139  Recent Lipid Panel No results found for: "CHOL", "TRIG", "HDL", "CHOLHDL", "VLDL", "LDLCALC", "LDLDIRECT"   Physical Exam:    VS:  BP 136/84 (BP Location: Left Arm, Patient Position: Sitting, Cuff Size: Large)   Pulse 81   Ht 5\' 6"  (1.676 m)   Wt 258 lb (117 kg)   SpO2 100%   BMI 41.64 kg/m     Wt Readings from Last 3 Encounters:  12/26/22 258 lb (117 kg)  12/15/22 254 lb 3.2 oz (115.3 kg)  10/15/22 235 lb 14.3 oz (107 kg)      GEN:  Well nourished, well developed in no acute distress HEENT: Normal NECK: No JVD; No carotid bruits LYMPHATICS: No lymphadenopathy CARDIAC: RRR, +murmur, rubs, gallops RESPIRATORY:  Clear to auscultation without rales, wheezing or rhonchi  ABDOMEN: Soft, non-tender, non-distended MUSCULOSKELETAL:  No edema; No deformity  SKIN: Warm and dry NEUROLOGIC:  Alert and oriented x 3 PSYCHIATRIC:  Normal affect   ASSESSMENT:    1. Mitral valve disease   2. Medication management   3. Essential hypertension    PLAN:    In order of problems  listed above:  Mitral valve stenosis HFpEF Recent echo showed LVEF 60-65%, G2DD, moderate-severe mitral stenosis, gradient 13.61mmHg. Patient reports persistent DOE. Long discussion about mitral valve stenosis, work-up and treatment. I will order a TEE and refer patient to structural heart team. She will likely need a heart cath. Patient is euvolemic on exam The current medical regimen is effective;  continue present plan and medications. Lasix 20mg  BID and Toprol 25mg  daily.   HTN BP is good today, continue Toprol 25mg  daily.   Disposition: Follow up in 3 month(s) with Md/APP   Shared Decision Making/Informed Consent   Informed Consent   Shared Decision Making/Informed Consent The risks [esophageal damage, perforation (1:10,000 risk), bleeding, pharyngeal hematoma as well as other potential complications associated with conscious sedation including aspiration, arrhythmia, respiratory failure and death], benefits (treatment guidance and diagnostic support) and alternatives of a transesophageal echocardiogram were discussed in detail with Ms. Walter and she is willing to proceed.        Signed, Norine Reddington David Stall, PA-C  12/26/2022 4:15 PM    [facility]

## 2022-12-26 NOTE — Progress Notes (Signed)
Message sent to scheduling 

## 2022-12-29 ENCOUNTER — Telehealth: Payer: Self-pay | Admitting: Physician Assistant

## 2022-12-29 DIAGNOSIS — I5032 Chronic diastolic (congestive) heart failure: Secondary | ICD-10-CM

## 2022-12-29 DIAGNOSIS — I059 Rheumatic mitral valve disease, unspecified: Secondary | ICD-10-CM

## 2022-12-29 NOTE — Telephone Encounter (Signed)
Please see staff messages correspondences below for reference.  Patient was referred to structural heart team for treatment options of severe mitral stenosis. Given age, she would be best served with surgical mitral valve intervention. Dr. Leafy Ro with [facility] reviewed patient's chart and thinks it is an appropriate surgical consultation and recommended TEE and L/RHC and referral to his office.   Recently seen by Cadence Furth PA-C in the office on 6/21 for follow up and set up for TEE only and referred back to structural. Will send this back to her to see if she can have heart cath set up and refer to Dr. Leafy Ro. Structural does not have anything to offer the patient and we will cancel the second referral.    _________________________  ----- Message ----- From: Debbe Odea, MD Sent: 12/24/2022   5:33 PM EDT To: Iona Coach, RN; Davina Poke, RN; * Subject: RE: Referral and Plan                          Please schedule TEE and left heart cath during follow-up appointment.  Thank you  ----- Message ----- From: Iona Coach, RN Sent: 12/24/2022  12:42 PM EDT To: Davina Poke, RN; Margrett Rud, CMA; * Subject: RE: Referral and Plan                          Hey team,  I have cancelled the structural heart referral and I can see that the pt has been scheduled for an office visit with Cadence on 6/21 to f/u on echo and discuss plan.  I reached out to [facility] and Dr Leafy Ro reviewed the patient's chart. He recommends a TEE and Cath and then referral to him for MS evaluation.  Hope this helps!  Thanks, Lauren ----- Message ----- From: Margrett Rud, CMA Sent: 12/23/2022   3:12 PM EDT To: Iona Coach, RN Subject: RE: Referral                                  Thank you so much for this information! ----- Message ----- From: Iona Coach, RN Sent: 12/23/2022   2:48 PM EDT To: Margrett Rud, CMA; Debbe Odea, MD Subject: Referral                                       Matthew Saras,  I reviewed this pt's chart and the structural heart team does not have any therapies to offer her.  The patient would require a cardiac cath and then referral to cardiothoracic surgeon for mitral stenosis evaluation. I will include Dr Azucena Cecil on this message too.  Thank you, Lauren ----- Message ----- From: Margrett Rud, CMA Sent: 12/23/2022   2:09 PM EDT To: Iona Coach, RN  Referral placed

## 2022-12-30 ENCOUNTER — Other Ambulatory Visit
Admission: RE | Admit: 2022-12-30 | Discharge: 2022-12-30 | Disposition: A | Discharge status: Home / Self Care | Payer: Medicaid Other | Source: Ambulatory Visit | Attending: Medical | Admitting: Medical

## 2022-12-30 DIAGNOSIS — Z79899 Other long term (current) drug therapy: Secondary | ICD-10-CM | POA: Diagnosis present

## 2022-12-30 LAB — BASIC METABOLIC PANEL
Anion gap: 7 (ref 5–15)
BUN: 8 mg/dL (ref 6–20)
CO2: 27 mmol/L (ref 22–32)
Calcium: 9.2 mg/dL (ref 8.9–10.3)
Chloride: 103 mmol/L (ref 98–111)
Creatinine, Ser: 0.85 mg/dL (ref 0.44–1.00)
GFR, Estimated: >60 mL/min (ref >60)
Glucose, Bld: 79 mg/dL (ref 70–99)
Potassium: 3.4 mmol/L — ABNORMAL LOW (ref 3.5–5.1)
Sodium: 137 mmol/L (ref 135–145)

## 2022-12-30 LAB — CBC
HCT: 36.7 % (ref 36.0–46.0)
Hemoglobin: 11.4 g/dL — ABNORMAL LOW (ref 12.0–15.0)
MCH: 25.7 pg — ABNORMAL LOW (ref 26.0–34.0)
MCHC: 31.1 g/dL (ref 30.0–36.0)
MCV: 82.7 fL (ref 80.0–100.0)
Platelets: 316 10*3/uL (ref 150–400)
RBC: 4.44 MIL/uL (ref 3.87–5.11)
RDW: 15.5 % (ref 11.5–15.5)
WBC: 8.9 10*3/uL (ref 4.0–10.5)
nRBC: 0 % (ref 0.0–0.2)

## 2023-01-01 ENCOUNTER — Ambulatory Visit
Admission: RE | Admit: 2023-01-01 | Discharge: 2023-01-01 | Disposition: A | Discharge status: Home / Self Care | Payer: Medicaid Other | Attending: Cardiology | Admitting: Cardiology

## 2023-01-01 ENCOUNTER — Other Ambulatory Visit: Payer: Self-pay | Admitting: Cardiology

## 2023-01-01 ENCOUNTER — Encounter: Payer: Self-pay | Admitting: Cardiology

## 2023-01-01 ENCOUNTER — Ambulatory Visit
Admission: RE | Admit: 2023-01-01 | Discharge: 2023-01-01 | Disposition: A | Discharge status: Home / Self Care | Payer: Medicaid Other | Source: Home / Self Care | Attending: Medical | Admitting: Medical

## 2023-01-01 ENCOUNTER — Ambulatory Visit: Payer: Medicaid Other | Admitting: Registered Nurse

## 2023-01-01 ENCOUNTER — Ambulatory Visit: Admit: 2023-01-01 | Payer: Medicaid Other | Admitting: Cardiology

## 2023-01-01 ENCOUNTER — Encounter
Admission: RE | Disposition: A | Discharge status: Home / Self Care | Payer: Self-pay | Source: Home / Self Care | Attending: Cardiology

## 2023-01-01 ENCOUNTER — Ambulatory Visit (HOSPITAL_BASED_OUTPATIENT_CLINIC_OR_DEPARTMENT_OTHER)
Admission: RE | Admit: 2023-01-01 | Discharge: 2023-01-01 | Disposition: A | Discharge status: Home / Self Care | Payer: Medicaid Other | Source: Home / Self Care | Attending: Cardiology | Admitting: Cardiology

## 2023-01-01 ENCOUNTER — Other Ambulatory Visit: Payer: Self-pay

## 2023-01-01 DIAGNOSIS — Z87891 Personal history of nicotine dependence: Secondary | ICD-10-CM | POA: Diagnosis not present

## 2023-01-01 DIAGNOSIS — I5032 Chronic diastolic (congestive) heart failure: Secondary | ICD-10-CM | POA: Insufficient documentation

## 2023-01-01 DIAGNOSIS — I050 Rheumatic mitral stenosis: Secondary | ICD-10-CM | POA: Diagnosis present

## 2023-01-01 DIAGNOSIS — R7303 Prediabetes: Secondary | ICD-10-CM | POA: Diagnosis not present

## 2023-01-01 DIAGNOSIS — J45909 Unspecified asthma, uncomplicated: Secondary | ICD-10-CM | POA: Diagnosis not present

## 2023-01-01 DIAGNOSIS — I059 Rheumatic mitral valve disease, unspecified: Secondary | ICD-10-CM

## 2023-01-01 DIAGNOSIS — I110 Hypertensive heart disease with heart failure: Secondary | ICD-10-CM | POA: Insufficient documentation

## 2023-01-01 DIAGNOSIS — Z79899 Other long term (current) drug therapy: Secondary | ICD-10-CM | POA: Insufficient documentation

## 2023-01-01 HISTORY — PX: TEE WITHOUT CARDIOVERSION: SHX5443

## 2023-01-01 HISTORY — DX: Meningitis, unspecified: G03.9

## 2023-01-01 LAB — ECHO TEE: Area-P 1/2: 4.36 cm2

## 2023-01-01 SURGERY — ECHOCARDIOGRAM, TRANSESOPHAGEAL
Anesthesia: Moderate Sedation

## 2023-01-01 SURGERY — ECHOCARDIOGRAM, TRANSESOPHAGEAL
Anesthesia: General

## 2023-01-01 MED ORDER — FUROSEMIDE 20 MG PO TABS: 40.0000 mg | ORAL_TABLET | Freq: Two times a day (BID) | ORAL | 3 refills | Status: DC

## 2023-01-01 MED ORDER — MIDAZOLAM HCL 2 MG/2ML IJ SOLN
INTRAMUSCULAR | Status: AC
  Filled 2023-01-01: qty 4

## 2023-01-01 MED ORDER — FENTANYL CITRATE (PF) 100 MCG/2ML IJ SOLN
INTRAMUSCULAR | Status: AC | PRN
  Administered 2023-01-01 (×3): 25 ug via INTRAVENOUS

## 2023-01-01 MED ORDER — FENTANYL CITRATE (PF) 100 MCG/2ML IJ SOLN
INTRAMUSCULAR | Status: AC
  Filled 2023-01-01: qty 2

## 2023-01-01 MED ORDER — MIDAZOLAM HCL 2 MG/2ML IJ SOLN
INTRAMUSCULAR | Status: AC | PRN
  Administered 2023-01-01 (×3): 1 mg via INTRAVENOUS

## 2023-01-01 MED ORDER — SODIUM CHLORIDE 0.9 % IV SOLN: INTRAVENOUS | Status: DC

## 2023-01-01 MED ORDER — LIDOCAINE VISCOUS HCL 2 % MT SOLN
OROMUCOSAL | Status: AC
  Filled 2023-01-01: qty 15

## 2023-01-01 MED ORDER — PROPOFOL 10 MG/ML IV BOLUS
INTRAVENOUS | Status: DC | PRN
  Administered 2023-01-01: 310 mg via INTRAVENOUS
  Administered 2023-01-01: 100 mg via INTRAVENOUS

## 2023-01-01 MED ORDER — BUTAMBEN-TETRACAINE-BENZOCAINE 2-2-14 % EX AERO
INHALATION_SPRAY | CUTANEOUS | Status: AC
  Filled 2023-01-01: qty 5

## 2023-01-01 MED ORDER — FUROSEMIDE 10 MG/ML IJ SOLN: 40.0000 mg | Freq: Once | INTRAMUSCULAR | Status: AC

## 2023-01-01 MED ORDER — POTASSIUM CHLORIDE CRYS ER 20 MEQ PO TBCR: 40.0000 meq | EXTENDED_RELEASE_TABLET | Freq: Once | ORAL | Status: AC

## 2023-01-01 MED ORDER — FUROSEMIDE 10 MG/ML IJ SOLN
INTRAMUSCULAR | Status: AC
  Administered 2023-01-01: 40 mg via INTRAVENOUS
  Filled 2023-01-01: qty 4

## 2023-01-01 MED ORDER — POTASSIUM CHLORIDE CRYS ER 20 MEQ PO TBCR
EXTENDED_RELEASE_TABLET | ORAL | Status: AC
  Administered 2023-01-01: 40 meq via ORAL
  Filled 2023-01-01: qty 2

## 2023-01-01 NOTE — Anesthesia Preprocedure Evaluation (Signed)
Anesthesia Evaluation  Patient identified by MRN, date of birth, ID band Patient awake  General Assessment Comment:Patient is 4 hours s/p moderate sedation for TEE attempt. On my interview, she is sleeping but easily arousable; patient says she has not slept well last night, and has not eaten since last night and is tired. She is AO x 3, and fully understands my informed consent. I deem her to have capacity to consent for this anesthetic.  Reviewed: Allergy & Precautions, NPO status , Patient's Chart, lab work & pertinent test results  History of Anesthesia Complications Negative for: history of anesthetic complications  Airway Mallampati: III  TM Distance: >3 FB Neck ROM: Full    Dental no notable dental hx. (+) Teeth Intact   Pulmonary neg pulmonary ROS, asthma , neg sleep apnea, neg COPD, Current Smoker and Patient abstained from smoking.   Pulmonary exam normal breath sounds clear to auscultation       Cardiovascular Exercise Tolerance: Good METShypertension, Pt. on medications + DOE  (-) CAD and (-) Past MI negative cardio ROS (-) dysrhythmias + Valvular Problems/Murmurs  Rhythm:Regular Rate:Normal + Systolic murmurs TTE 2024: 1. Left ventricular ejection fraction, by estimation, is 60 to 65%. The  left ventricle has normal function. The left ventricle has no regional  wall motion abnormalities. Left ventricular diastolic parameters are  consistent with Grade II diastolic  dysfunction (pseudonormalization).   2. Right ventricular systolic function is normal. The right ventricular  size is normal.   3. Left atrial size was severely dilated.   4. P1/2time = , MVA (via pressure 1/2 time)= 2.0 cm2. The mitral  valve is rheumatic. Mild mitral valve regurgitation. Moderate to severe  mitral stenosis. The mean mitral valve gradient is 13.5 mmHg.   5. The aortic valve is normal in structure. Aortic valve regurgitation is  not  visualized.   6. The inferior vena cava is normal in size with greater than 50%  respiratory variability, suggesting right atrial pressure of 3 mmHg.     Neuro/Psych  PSYCHIATRIC DISORDERS  Depression    Stroke in chart; patient denies    GI/Hepatic ,neg GERD  ,,(+)     (-) substance abuse    Endo/Other  neg diabetes  Morbid obesity  Renal/GU negative Renal ROS     Musculoskeletal   Abdominal  (+) + obese  Peds  Hematology   Anesthesia Other Findings Past Medical History: No date: Abscess, peritonsillar No date: Asthma No date: Back pain No date: Chronic diastolic heart failure (HCC) No date: Cubital tunnel syndrome No date: DOE (dyspnea on exertion) No date: Hidradenitis suppurativa No date: Hypertension No date: Hypertension No date: Major depressive disorder, recurrent episode, moderate (HCC) No date: Meningitis     Comment:  as an infant No date: Mitral stenosis No date: Patellar tendinitis No date: Prediabetes No date: Stroke (HCC) No date: Unspecified disorder of binocular vision  Reproductive/Obstetrics                              Anesthesia Physical Anesthesia Plan  ASA: 3  Anesthesia Plan: General   Post-op Pain Management: Minimal or no pain anticipated   Induction: Intravenous  PONV Risk Score and Plan: 2 and Propofol infusion, TIVA and Ondansetron  Airway Management Planned: Nasal Cannula and Natural Airway  Additional Equipment: None  Intra-op Plan:   Post-operative Plan:   Informed Consent: I have reviewed the patients History and Physical, chart, labs  and discussed the procedure including the risks, benefits and alternatives for the proposed anesthesia with the patient or authorized representative who has indicated his/her understanding and acceptance.     Dental advisory given  Plan Discussed with: CRNA and Surgeon  Anesthesia Plan Comments: (Discussed risks of anesthesia with patient, including  possibility of difficulty with spontaneous ventilation under anesthesia necessitating airway intervention, PONV, and rare risks such as cardiac or respiratory or neurological events, and allergic reactions. Discussed the role of CRNA in patient's perioperative care. Patient understands. Patient counseled on benefits of smoking cessation, and increased perioperative risks associated with continued smoking. )         Anesthesia Quick Evaluation

## 2023-01-01 NOTE — Progress Notes (Signed)
MD speaking with pt. And her father [name] re: delay of procedure. Both verbalize understanding of conversation. Pt. Still coughiing moderately. To reschedule TEE per MD with anesthesia.

## 2023-01-01 NOTE — Sedation Documentation (Signed)
Family updated as to patient's status.

## 2023-01-01 NOTE — Procedures (Signed)
Transesophageal Echocardiogram :  Indication: mitral stenosis Requesting/ordering  physician:   Procedure: 10ml of viscous lidocaine were given orally to provide local anesthesia to the oropharynx. The patient was positioned supine on the left side, bite block provided. The patient was sedated per anesthesia team.  Using digital technique an omniplane probe was advanced into the esophagus without incident.   See report in EPIC  for complete details: In brief, imaging revealed normal LV function with no RWMAs and no mural apical thrombus.  .  Estimated ejection fraction was 60%%.  Right sided cardiac chambers were normal with no evidence of pulmonary hypertension.  There is moderate to severe mitral valve stenosis  Imaging of the septum showed no ASD or VSD Bubble study was negative for shunt 2D and color flow confirmed no PFO  The LA was well visualized in orthogonal views.  There was no spontaneous contrast and no thrombus in the LA and LA appendage   The descending thoracic aorta had no  mural aortic debris with no evidence of aneurysmal dilation or disection  Conclusion: Moderate to severe mitral valve stenosis Increase lasix to 40mg  in am and 20mg  in pm. Will plan R/LHC and CT surgery referral. Discussed in detail with patient and father/family.   Informed Consent   Shared Decision Making/Informed Consent The risks [stroke (1 in 1000), death (1 in 1000), kidney failure [usually temporary] (1 in 500), bleeding (1 in 200), allergic reaction [possibly serious] (1 in 200)], benefits (diagnostic support and management of coronary artery disease) and alternatives of a cardiac catheterization were discussed in detail with Tiffany Hickman and she is willing to proceed.      Arlys John Agbor-Etang 01/01/2023 1:26 PM

## 2023-01-01 NOTE — Sedation Documentation (Signed)
Reaching out to anesthesia to check their availabiltiy : not available at present.

## 2023-01-01 NOTE — Progress Notes (Signed)
Anesthesia came by : to do repeat procedure at 12:30 Pm today.

## 2023-01-01 NOTE — Sedation Documentation (Signed)
Procedure ended: pt. Unable to tolerate probe & coughing freqently.

## 2023-01-01 NOTE — Anesthesia Postprocedure Evaluation (Signed)
Anesthesia Post Note  Patient: Tiffany Hickman  Procedure(s) Performed: TRANSESOPHAGEAL ECHOCARDIOGRAM (TEE)  Patient location during evaluation: Specials Recovery Anesthesia Type: General Level of consciousness: awake and alert Pain management: pain level controlled Vital Signs Assessment: post-procedure vital signs reviewed and stable Respiratory status: spontaneous breathing, nonlabored ventilation, respiratory function stable and patient connected to nasal cannula oxygen Cardiovascular status: blood pressure returned to baseline and stable Postop Assessment: no apparent nausea or vomiting Anesthetic complications: no   No notable events documented.   Last Vitals:  Vitals:   01/01/23 1323 01/01/23 1330  BP:  (!) 148/77  Pulse: 74 71  Resp: (!) 30 (!) 42  Temp:    SpO2: 96% 97%    Last Pain:  Vitals:   01/01/23 1230  TempSrc:   PainSc: 0-No pain                 Corinda Gubler

## 2023-01-01 NOTE — Procedures (Addendum)
Transesophageal Echocardiogram :  Indication: Mitral valve stenosis  Patient had dry coughing prior to procedure.  Held her typical dose of Lasix 20 mg twice daily prior to procedure.  Procedure: 10 ml of viscous lidocaine were given orally to provide local anesthesia to the oropharynx. The patient was positioned supine on the left side, bite block provided. The patient was moderately sedated with the doses of versed and fentanyl as detailed below.    Moderate sedation: 1. Sedation used:  Versed: 3mg , Fentanyl: 75 mcg  Patient given 3 mg of Versed, 75 mcg fentanyl total, despite that she had persistent cough, very uncomfortable, when probe insertion was attempted.  Procedure was then stop.  Case discussed with anesthesia team.  Tentative plan is for patient to recover from sedation.  Will plan TEE later today with anesthesia.  Due to persistent cough, and patient missing a.m. dose of Lasix, mitral valve stenosis, will give IV Lasix 40 mg x 1 and Kcl in an attempt to optimize volume prior to procedure.  Discussed with patient and family at bedside.   Arlys John Agbor-Etang 01/01/2023 8:28 AM

## 2023-01-01 NOTE — Progress Notes (Signed)
*  PRELIMINARY RESULTS* Echocardiogram Echocardiogram Transesophageal has been performed.  Tiffany Hickman 01/01/2023, 1:33 PM

## 2023-01-01 NOTE — Interval H&P Note (Signed)
History and Physical Interval Note:  01/01/2023 8:26 AM  Tiffany Hickman  has presented today for surgery, with the diagnosis of Mitral valve stenosis  .  The various methods of treatment have been discussed with the patient and family. After consideration of risks, benefits and other options for treatment, the patient has consented to  Procedure(s): TRANSESOPHAGEAL ECHOCARDIOGRAM (TEE) (N/A) as a surgical intervention.  The patient's history has been reviewed, patient examined, no change in status, stable for surgery.  I have reviewed the patient's chart and labs.  Questions were answered to the patient's satisfaction.     Arlys John Agbor-Etang

## 2023-01-01 NOTE — Transfer of Care (Signed)
Immediate Anesthesia Transfer of Care Note  Patient: Tiffany Hickman  Procedure(s) Performed: TRANSESOPHAGEAL ECHOCARDIOGRAM (TEE)  Patient Location:  Special Procedures  Anesthesia Type:General  Level of Consciousness: drowsy and patient cooperative  Airway & Oxygen Therapy: Patient Spontanous Breathing and Patient connected to nasal cannula oxygen  Post-op Assessment: Report given to RN and Post -op Vital signs reviewed and stable  Post vital signs: Reviewed and stable  Last Vitals:  Vitals Value Taken Time  BP 151/95 01/01/23 1321  Temp    Pulse 74 01/01/23 1323  Resp 30 01/01/23 1323  SpO2 96 % 01/01/23 1323    Last Pain:  Vitals:   01/01/23 1230  TempSrc:   PainSc: 0-No pain         Complications: No notable events documented.

## 2023-01-02 ENCOUNTER — Encounter: Payer: Self-pay | Admitting: Emergency Medicine

## 2023-01-02 ENCOUNTER — Telehealth: Payer: Self-pay | Admitting: Emergency Medicine

## 2023-01-02 ENCOUNTER — Encounter: Payer: Self-pay | Admitting: Cardiology

## 2023-01-02 NOTE — Telephone Encounter (Signed)
Unable to reach patient by phone. Letter mailed to patient with the following recommendation from Cadence Furth PA-C.   Potassium is low as she is on lasix. Lets start klor con daily.

## 2023-01-02 NOTE — Telephone Encounter (Signed)
-----   Message from Parke Poisson, RN sent at 01/01/2023  4:15 PM EDT ----- Left a message for the patient to call back.

## 2023-01-06 NOTE — Addendum Note (Signed)
Addended by: Vivi Barrack on: 01/06/2023 10:13 AM   Modules accepted: Orders

## 2023-01-06 NOTE — Telephone Encounter (Signed)
Left VM advising patient to call our office to schedule L/RHC.  Referral placed to CT surgery as recommended by provider.

## 2023-01-09 NOTE — Telephone Encounter (Signed)
Forwarding to Cadence, PA to review these messages and advise if the patient should come back in to discuss right & left heart cath and have this set up.  She is scheduled to see Dr. Leafy Ro on 01/26/23.

## 2023-01-13 NOTE — Telephone Encounter (Signed)
Spoke with patient and she is open to come in to discuss cardiac cath procedure. Patient is scheduled Thursday 01/15/2023 at 1:55 PM with PA-C Fransico Michael

## 2023-01-13 NOTE — Telephone Encounter (Signed)
Marianne Sofia, PA-C  Sent: Tue January 13, 2023 11:12 AM  To: Jefferey Pica, RN         Message  Yes, lets bring her in to discuss heart cath.

## 2023-01-15 ENCOUNTER — Ambulatory Visit: Payer: MEDICAID | Attending: Medical | Admitting: Medical

## 2023-01-15 NOTE — Progress Notes (Deleted)
Cardiology Office Note:    Date:  01/15/2023   ID:  Tiffany Hickman, DOB 1980-07-26, MRN [id number]  PCP:  [facility]  [facility] Cardiologist:  Debbe Odea, MD  [facility] Electrophysiologist:  None   Referring MD: [facility]   Chief Complaint: ***  History of Present Illness:    Tiffany Hickman is a 42 y.o. female with a hx of  HTN, mitral stenosis who presents for 3 week follow-up.    Patient was seen 12/15/22 for SOB. Echo at that time showed normal LVSF with moderate to severe mitral valve stenosis, and was started on Toprol and lasix.    The patient was last seen 12/15/22 and reported DOE. Echo was ordered. This showed LVEF 60-65%, no WMA, G2DD, severely dilated LA, mild MR, mod to severe MS, mean gradient 13.41mmHg.   Last seen 12/26/22 and she was having DOE. TEE was ordered.   Past Medical History:  Diagnosis Date   Abscess, peritonsillar    Asthma    Back pain    Chronic diastolic heart failure (HCC)    Cubital tunnel syndrome    DOE (dyspnea on exertion)    Hidradenitis suppurativa    Hypertension    Hypertension    Major depressive disorder, recurrent episode, moderate (HCC)    Meningitis    as an infant   Mitral stenosis    Patellar tendinitis    Prediabetes    Stroke (HCC)    Unspecified disorder of binocular vision     Past Surgical History:  Procedure Laterality Date   SKIN GRAFT     LEFT FOOT   SKIN GRAFT Left    as an infant: Left foot   TEE WITHOUT CARDIOVERSION N/A 01/01/2023   Procedure: TRANSESOPHAGEAL ECHOCARDIOGRAM (TEE);  Surgeon: Debbe Odea, MD;  Location: [facility] ORS;  Service: Cardiovascular;  Laterality: N/A;    Current Medications: No outpatient medications have been marked as taking for the 01/15/23 encounter (Appointment) with Fransico Michael,  H, PA-C.     Allergies:   Patient has no known allergies.   Social History   Socioeconomic History   Marital status: Single     Spouse name: Not on file   Number of children: 7   Years of education: Not on file   Highest education level: Not on file  Occupational History   Not on file  Tobacco Use   Smoking status: Some Days    Types: Cigarettes   Smokeless tobacco: Never  Vaping Use   Vaping status: Never Used  Substance and Sexual Activity   Alcohol use: Not Currently   Drug use: No   Sexual activity: Not on file  Other Topics Concern   Not on file  Social History Narrative   Lives with children; dad lives in town    Social Determinants of Health   Financial Resource Strain: Not on file  Food Insecurity: Not on file  Transportation Needs: Not on file  Physical Activity: Not on file  Stress: Not on file  Social Connections: Not on file     Family History: The patient's ***family history includes COPD in her father; Diabetes in her mother; Hypertension in her father and mother.  ROS:   Please see the history of present illness.    *** All other systems reviewed and are negative.  EKGs/Labs/Other Studies Reviewed:    The following studies were reviewed today: ***  EKG:  EKG is *** ordered today.  The ekg ordered  today demonstrates ***  Recent Labs: 05/25/2022: B Natriuretic Peptide 45.7 12/30/2022: BUN 8; Creatinine, Ser 0.85; Hemoglobin 11.4; Platelets 316; Potassium 3.4; Sodium 137  Recent Lipid Panel No results found for: "CHOL", "TRIG", "HDL", "CHOLHDL", "VLDL", "LDLCALC", "LDLDIRECT"   Risk Assessment/Calculations:   {Does this patient have ATRIAL FIBRILLATION?:[phone]}   Physical Exam:    VS:  There were no vitals taken for this visit.    Wt Readings from Last 3 Encounters:  01/01/23 258 lb (117 kg)  12/26/22 258 lb (117 kg)  12/15/22 254 lb 3.2 oz (115.3 kg)     GEN: *** Well nourished, well developed in no acute distress HEENT: Normal NECK: No JVD; No carotid bruits LYMPHATICS: No lymphadenopathy CARDIAC: ***RRR, no murmurs, rubs, gallops RESPIRATORY:  Clear  to auscultation without rales, wheezing or rhonchi  ABDOMEN: Soft, non-tender, non-distended MUSCULOSKELETAL:  No edema; No deformity  SKIN: Warm and dry NEUROLOGIC:  Alert and oriented x 3 PSYCHIATRIC:  Normal affect   ASSESSMENT:    No diagnosis found. PLAN:    In order of problems listed above:  ***  Disposition: Follow up {follow up:15908} with ***   Shared Decision Making/Informed Consent   {Are you ordering a CV Procedure (e.g. stress test, cath, DCCV, TEE, etc)?   Press F2        :[id number]}    Signed,  David Stall, PA-C  01/15/2023 7:10 AM    [facility]

## 2023-01-16 ENCOUNTER — Encounter: Payer: Self-pay | Admitting: Medical

## 2023-01-25 NOTE — Progress Notes (Unsigned)
[address]       [place] [address]             [phone]           Tiffany Hickman [place] Medical Record #[id number] Date of Birth: 06/25/1981  Debbe Odea, MD [facility]  Chief Complaint:     History of Present Illness:           Past Medical History:  Diagnosis Date   Abscess, peritonsillar    Asthma    Back pain    Chronic diastolic heart failure (HCC)    Cubital tunnel syndrome    DOE (dyspnea on exertion)    Hidradenitis suppurativa    Hypertension    Hypertension    Major depressive disorder, recurrent episode, moderate (HCC)    Meningitis    as an infant   Mitral stenosis    Patellar tendinitis    Prediabetes    Stroke (HCC)    Unspecified disorder of binocular vision     Past Surgical History:  Procedure Laterality Date   SKIN GRAFT     LEFT FOOT   SKIN GRAFT Left    as an infant: Left foot   TEE WITHOUT CARDIOVERSION N/A 01/01/2023   Procedure: TRANSESOPHAGEAL ECHOCARDIOGRAM (TEE);  Surgeon: Debbe Odea, MD;  Location: [facility] ORS;  Service: Cardiovascular;  Laterality: N/A;    Social History   Tobacco Use  Smoking Status Some Days   Types: Cigarettes  Smokeless Tobacco Never    Social History   Substance and Sexual Activity  Alcohol Use Not Currently    Social History   Socioeconomic History   Marital status: Single    Spouse name: Not on file   Number of children: 7   Years of education: Not on file   Highest education level: Not on file  Occupational History   Not on file  Tobacco Use   Smoking status: Some Days    Types: Cigarettes   Smokeless tobacco: Never  Vaping Use   Vaping status: Never Used  Substance and Sexual Activity   Alcohol use: Not Currently   Drug use: No   Sexual activity: Not on file  Other Topics Concern   Not on file  Social History Narrative   Lives with children; dad lives in town    Social Determinants of Health    Financial Resource Strain: Not on file  Food Insecurity: Not on file  Transportation Needs: Not on file  Physical Activity: Not on file  Stress: Not on file  Social Connections: Not on file  Intimate Partner Violence: Not on file    No Known Allergies  Current Outpatient Medications  Medication Sig Dispense Refill   albuterol (VENTOLIN HFA) 108 (90 Base) MCG/ACT inhaler Inhale 2 puffs into the lungs every 6 (six) hours as needed for wheezing or shortness of breath. (Patient not taking: Reported on 12/15/2022) 1 each 2   albuterol (VENTOLIN HFA) 108 (90 Base) MCG/ACT inhaler Inhale 2 puffs into the lungs every 6 (six) hours as needed for wheezing or shortness of breath. 8 g 0   azithromycin (ZITHROMAX Z-PAK) 250 MG tablet Take 2 tablets (500 mg) on  Day 1,  followed by 1 tablet (250 mg) once daily on Days 2 through 5. (Patient not taking: Reported on 11/21/2022) 6 each 0   benzonatate (TESSALON PERLES) 100 MG capsule Take 1 capsule (100 mg total) by mouth every 6 (six) hours as  needed for cough. (Patient not taking: Reported on 11/21/2022) 20 capsule 0   butalbital-acetaminophen-caffeine (FIORICET) 50-325-40 MG tablet Take 1-2 tablets by mouth every 8 (eight) hours as needed for headache. Do not drive within 8 hour of use. (Patient not taking: Reported on 11/21/2022) 20 tablet 0   doxycycline (VIBRAMYCIN) 100 MG capsule Take 1 capsule (100 mg total) by mouth 2 (two) times daily. (Patient not taking: Reported on 11/21/2022) 20 capsule 0   furosemide (LASIX) 20 MG tablet Take 2 tablets (40 mg total) by mouth 2 (two) times daily. Take 40mg  (2 tablets) in am and 20mg  (1 tablet) in pm 180 tablet 3   guaiFENesin (ROBITUSSIN) 100 MG/5ML SOLN Take 5 mLs (100 mg total) by mouth every 4 (four) hours as needed for cough or to loosen phlegm. (Patient not taking: Reported on 11/21/2022) 120 mL 0   hydrochlorothiazide (HYDRODIURIL) 25 MG tablet Take 25 mg by mouth daily. (Patient not taking: Reported on  11/21/2022)     losartan (COZAAR) 25 MG tablet Take 1 tablet (25 mg total) by mouth daily. 90 tablet 3   metFORMIN (GLUCOPHAGE) 500 MG tablet Take 500 mg by mouth daily with breakfast. (Patient not taking: Reported on 11/21/2022)     metoprolol succinate (TOPROL XL) 25 MG 24 hr tablet Take 1 tablet (25 mg total) by mouth daily. 90 tablet 3   potassium chloride SA (KLOR-CON M) 20 MEQ tablet Take 1 tablet (20 mEq total) by mouth daily. 90 tablet 3   traMADol (ULTRAM) 50 MG tablet Take 1 tablet (50 mg total) by mouth every 6 (six) hours as needed. (Patient not taking: Reported on 12/15/2022) 15 tablet 0   Vitamin D, Ergocalciferol, (DRISDOL) 1.25 MG (50000 UNIT) CAPS capsule Take 50,000 Units by mouth once a week.     No current facility-administered medications for this visit.     Family History  Problem Relation Age of Onset   Diabetes Mother    Hypertension Mother    Hypertension Father    COPD Father        Physical Exam: LMP  (Approximate)      Diagnostic Studies & Laboratory data: I have personally reviewed the following studies and agree with the findings   TEE (12/2022) IMPRESSIONS     1. Left ventricular ejection fraction, by estimation, is 55 to 60%. The  left ventricle has normal function.   2. Right ventricular systolic function is normal. The right ventricular  size is normal.   3. Left atrial size was moderately dilated. No left atrial/left atrial  appendage thrombus was detected.   4. P1/2t=140 ms, mitral valve area = 1.3cm2 (via 3D planimetry) and  1.5cm2 (via P1/2t method).. The mitral valve is rheumatic. Mild mitral  valve regurgitation. Severe mitral stenosis. The mean mitral valve  gradient is 12.0 mmHg.   5. The aortic valve is tricuspid. Aortic valve regurgitation is not  visualized.   FINDINGS   Left Ventricle: Left ventricular ejection fraction, by estimation, is 55  to 60%. The left ventricle has normal function. The left ventricular  internal  cavity size was normal in size.   Right Ventricle: The right ventricular size is normal. No increase in  right ventricular wall thickness. Right ventricular systolic function is  normal.   Left Atrium: Left atrial size was moderately dilated. No left atrial/left  atrial appendage thrombus was detected.   Right Atrium: Right atrial size was normal in size.   Pericardium: There is no evidence of pericardial effusion.  Mitral Valve: P1/2t=140 ms, mitral valve area = 1.3cm2 (via 3D planimetry)  and 1.5cm2 (via P1/2t method). The mitral valve is rheumatic. Mild mitral  valve regurgitation. Severe mitral valve stenosis. MV peak gradient, 19.5  mmHg. The mean mitral valve  gradient is 12.0 mmHg.   Tricuspid Valve: The tricuspid valve is normal in structure. Tricuspid  valve regurgitation is trivial.   Aortic Valve: The aortic valve is tricuspid. Aortic valve regurgitation is  not visualized.   Pulmonic Valve: The pulmonic valve was normal in structure. Pulmonic valve  regurgitation is not visualized.   Aorta: The aortic root is normal in size and structure.   IAS/Shunts: No atrial level shunt detected by color flow Doppler.     MITRAL VALVE  MV Area (PHT): 4.36 cm  MV Peak grad:  19.5 mmHg  MV Mean grad:  12.0 mmHg  MV Vmax:       2.21 m/s  MV Vmean:      162.0 cm/s  MV Decel Time: 174 msec  MV E velocity: 140.00 cm/s  MV A velocity: 190.00 cm/s  MV E/A ratio:  0.74   Recent Radiology Findings:       Recent Lab Findings: Lab Results  Component Value Date   WBC 8.9 12/30/2022   HGB 11.4 (L) 12/30/2022   HCT 36.7 12/30/2022   PLT 316 12/30/2022   GLUCOSE 79 12/30/2022   ALT 18 09/29/2019   AST 20 09/29/2019   NA 137 12/30/2022   K 3.4 (L) 12/30/2022   CL 103 12/30/2022   CREATININE 0.85 12/30/2022   BUN 8 12/30/2022   CO2 27 12/30/2022      Assessment / Plan:        I have spent *** min in review of the records, viewing studies and in face to face with  patient and in coordination of future care    Eugenio Hoes 01/25/2023 7:52 PM

## 2023-01-26 ENCOUNTER — Institutional Professional Consult (permissible substitution): Payer: MEDICAID | Admitting: Thoracic Surgery (Cardiothoracic Vascular Surgery)

## 2023-03-15 NOTE — Progress Notes (Deleted)
[address]       [place] [address]             [phone]           Tiffany Hickman [facility] Medical Record #[id number] Date of Birth: Mar 28, 1981  Debbe Odea, MD Center, [facility]  Chief Complaint:     History of Present Illness:           Past Medical History:  Diagnosis Date   Abscess, peritonsillar    Asthma    Back pain    Chronic diastolic heart failure (HCC)    Cubital tunnel syndrome    DOE (dyspnea on exertion)    Hidradenitis suppurativa    Hypertension    Hypertension    Major depressive disorder, recurrent episode, moderate (HCC)    Meningitis    as an infant   Mitral stenosis    Patellar tendinitis    Prediabetes    Stroke (HCC)    Unspecified disorder of binocular vision     Past Surgical History:  Procedure Laterality Date   SKIN GRAFT     LEFT FOOT   SKIN GRAFT Left    as an infant: Left foot   TEE WITHOUT CARDIOVERSION N/A 01/01/2023   Procedure: TRANSESOPHAGEAL ECHOCARDIOGRAM (TEE);  Surgeon: Debbe Odea, MD;  Location: [facility] ORS;  Service: Cardiovascular;  Laterality: N/A;    Social History   Tobacco Use  Smoking Status Some Days   Types: Cigarettes  Smokeless Tobacco Never    Social History   Substance and Sexual Activity  Alcohol Use Not Currently    Social History   Socioeconomic History   Marital status: Single    Spouse name: Not on file   Number of children: 7   Years of education: Not on file   Highest education level: Not on file  Occupational History   Not on file  Tobacco Use   Smoking status: Some Days    Types: Cigarettes   Smokeless tobacco: Never  Vaping Use   Vaping status: Never Used  Substance and Sexual Activity   Alcohol use: Not Currently   Drug use: No   Sexual activity: Not on file  Other Topics Concern   Not on file  Social History Narrative   Lives with children; dad lives in town    Social Determinants of Health    Financial Resource Strain: Not on file  Food Insecurity: Not on file  Transportation Needs: Not on file  Physical Activity: Not on file  Stress: Not on file  Social Connections: Not on file  Intimate Partner Violence: Not on file    No Known Allergies  Current Outpatient Medications  Medication Sig Dispense Refill   albuterol (VENTOLIN HFA) 108 (90 Base) MCG/ACT inhaler Inhale 2 puffs into the lungs every 6 (six) hours as needed for wheezing or shortness of breath. (Patient not taking: Reported on 12/15/2022) 1 each 2   albuterol (VENTOLIN HFA) 108 (90 Base) MCG/ACT inhaler Inhale 2 puffs into the lungs every 6 (six) hours as needed for wheezing or shortness of breath. 8 g 0   azithromycin (ZITHROMAX Z-PAK) 250 MG tablet Take 2 tablets (500 mg) on  Day 1,  followed by 1 tablet (250 mg) once daily on Days 2 through 5. (Patient not taking: Reported on 11/21/2022) 6 each 0   benzonatate (TESSALON PERLES) 100 MG capsule Take 1 capsule (100 mg total) by mouth every 6 (six) hours as  needed for cough. (Patient not taking: Reported on 11/21/2022) 20 capsule 0   butalbital-acetaminophen-caffeine (FIORICET) 50-325-40 MG tablet Take 1-2 tablets by mouth every 8 (eight) hours as needed for headache. Do not drive within 8 hour of use. (Patient not taking: Reported on 11/21/2022) 20 tablet 0   doxycycline (VIBRAMYCIN) 100 MG capsule Take 1 capsule (100 mg total) by mouth 2 (two) times daily. (Patient not taking: Reported on 11/21/2022) 20 capsule 0   furosemide (LASIX) 20 MG tablet Take 2 tablets (40 mg total) by mouth 2 (two) times daily. Take 40mg  (2 tablets) in am and 20mg  (1 tablet) in pm 180 tablet 3   guaiFENesin (ROBITUSSIN) 100 MG/5ML SOLN Take 5 mLs (100 mg total) by mouth every 4 (four) hours as needed for cough or to loosen phlegm. (Patient not taking: Reported on 11/21/2022) 120 mL 0   hydrochlorothiazide (HYDRODIURIL) 25 MG tablet Take 25 mg by mouth daily. (Patient not taking: Reported on  11/21/2022)     losartan (COZAAR) 25 MG tablet Take 1 tablet (25 mg total) by mouth daily. 90 tablet 3   metFORMIN (GLUCOPHAGE) 500 MG tablet Take 500 mg by mouth daily with breakfast. (Patient not taking: Reported on 11/21/2022)     metoprolol succinate (TOPROL XL) 25 MG 24 hr tablet Take 1 tablet (25 mg total) by mouth daily. 90 tablet 3   potassium chloride SA (KLOR-CON M) 20 MEQ tablet Take 1 tablet (20 mEq total) by mouth daily. 90 tablet 3   traMADol (ULTRAM) 50 MG tablet Take 1 tablet (50 mg total) by mouth every 6 (six) hours as needed. (Patient not taking: Reported on 12/15/2022) 15 tablet 0   Vitamin D, Ergocalciferol, (DRISDOL) 1.25 MG (50000 UNIT) CAPS capsule Take 50,000 Units by mouth once a week.     No current facility-administered medications for this visit.     Family History  Problem Relation Age of Onset   Diabetes Mother    Hypertension Mother    Hypertension Father    COPD Father        Physical Exam:      Diagnostic Studies & Laboratory data: I have personally reviewed the following studies and agree with the findings   TEE (12/2022) IMPRESSIONS     1. Left ventricular ejection fraction, by estimation, is 55 to 60%. The  left ventricle has normal function.   2. Right ventricular systolic function is normal. The right ventricular  size is normal.   3. Left atrial size was moderately dilated. No left atrial/left atrial  appendage thrombus was detected.   4. P1/2t=140 ms, mitral valve area = 1.3cm2 (via 3D planimetry) and  1.5cm2 (via P1/2t method).. The mitral valve is rheumatic. Mild mitral  valve regurgitation. Severe mitral stenosis. The mean mitral valve  gradient is 12.0 mmHg.   5. The aortic valve is tricuspid. Aortic valve regurgitation is not  visualized.   FINDINGS   Left Ventricle: Left ventricular ejection fraction, by estimation, is 55  to 60%. The left ventricle has normal function. The left ventricular  internal cavity size was normal  in size.   Right Ventricle: The right ventricular size is normal. No increase in  right ventricular wall thickness. Right ventricular systolic function is  normal.   Left Atrium: Left atrial size was moderately dilated. No left atrial/left  atrial appendage thrombus was detected.   Right Atrium: Right atrial size was normal in size.   Pericardium: There is no evidence of pericardial effusion.   Mitral  Valve: P1/2t=140 ms, mitral valve area = 1.3cm2 (via 3D planimetry)  and 1.5cm2 (via P1/2t method). The mitral valve is rheumatic. Mild mitral  valve regurgitation. Severe mitral valve stenosis. MV peak gradient, 19.5  mmHg. The mean mitral valve  gradient is 12.0 mmHg.   Tricuspid Valve: The tricuspid valve is normal in structure. Tricuspid  valve regurgitation is trivial.   Aortic Valve: The aortic valve is tricuspid. Aortic valve regurgitation is  not visualized.   Pulmonic Valve: The pulmonic valve was normal in structure. Pulmonic valve  regurgitation is not visualized.   Aorta: The aortic root is normal in size and structure.   IAS/Shunts: No atrial level shunt detected by color flow Doppler.     MITRAL VALVE  MV Area (PHT): 4.36 cm  MV Peak grad:  19.5 mmHg  MV Mean grad:  12.0 mmHg  MV Vmax:       2.21 m/s  MV Vmean:      162.0 cm/s  MV Decel Time: 174 msec  MV E velocity: 140.00 cm/s  MV A velocity: 190.00 cm/s  MV E/A ratio:  0.74    Recent Radiology Findings:       Recent Lab Findings: Lab Results  Component Value Date   WBC 8.9 12/30/2022   HGB 11.4 (L) 12/30/2022   HCT 36.7 12/30/2022   PLT 316 12/30/2022   GLUCOSE 79 12/30/2022   ALT 18 09/29/2019   AST 20 09/29/2019   NA 137 12/30/2022   K 3.4 (L) 12/30/2022   CL 103 12/30/2022   CREATININE 0.85 12/30/2022   BUN 8 12/30/2022   CO2 27 12/30/2022      Assessment / Plan:        I have spent *** min in review of the records, viewing studies and in face to face with patient and in  coordination of future care    Eugenio Hoes 03/15/2023 5:17 PM

## 2023-03-16 ENCOUNTER — Encounter: Payer: Self-pay | Admitting: Thoracic Surgery (Cardiothoracic Vascular Surgery)

## 2023-03-16 ENCOUNTER — Encounter: Payer: MEDICAID | Admitting: Thoracic Surgery (Cardiothoracic Vascular Surgery)

## 2023-04-01 ENCOUNTER — Encounter: Payer: Self-pay | Admitting: Cardiology

## 2023-04-01 ENCOUNTER — Ambulatory Visit: Payer: MEDICAID | Attending: Cardiology | Admitting: Cardiology

## 2023-07-06 ENCOUNTER — Emergency Department
Admission: EM | Admit: 2023-07-06 | Discharge: 2023-07-06 | Disposition: A | Discharge status: Home / Self Care | Payer: MEDICAID | Attending: Emergency Medicine | Admitting: Emergency Medicine

## 2023-07-06 ENCOUNTER — Emergency Department: Payer: MEDICAID

## 2023-07-06 ENCOUNTER — Other Ambulatory Visit: Payer: Self-pay

## 2023-07-06 DIAGNOSIS — Z20822 Contact with and (suspected) exposure to covid-19: Secondary | ICD-10-CM | POA: Insufficient documentation

## 2023-07-06 DIAGNOSIS — I110 Hypertensive heart disease with heart failure: Secondary | ICD-10-CM | POA: Insufficient documentation

## 2023-07-06 DIAGNOSIS — I509 Heart failure, unspecified: Secondary | ICD-10-CM | POA: Diagnosis not present

## 2023-07-06 DIAGNOSIS — R059 Cough, unspecified: Secondary | ICD-10-CM | POA: Diagnosis present

## 2023-07-06 DIAGNOSIS — J40 Bronchitis, not specified as acute or chronic: Secondary | ICD-10-CM | POA: Diagnosis not present

## 2023-07-06 LAB — RESP PANEL BY RT-PCR (RSV, FLU A&B, COVID)  RVPGX2
Influenza A by PCR: NEGATIVE
Influenza B by PCR: NEGATIVE
Resp Syncytial Virus by PCR: NEGATIVE
SARS Coronavirus 2 by RT PCR: NEGATIVE

## 2023-07-06 MED ORDER — PREDNISONE 50 MG PO TABS: 50.0000 mg | ORAL_TABLET | Freq: Every day | ORAL | 0 refills | Status: DC

## 2023-07-06 MED ORDER — PREDNISONE 20 MG PO TABS
60.0000 mg | ORAL_TABLET | Freq: Once | ORAL | Status: AC
  Administered 2023-07-06: 60 mg via ORAL
  Filled 2023-07-06: qty 3

## 2023-07-06 MED ORDER — IPRATROPIUM-ALBUTEROL 0.5-2.5 (3) MG/3ML IN SOLN
3.0000 mL | Freq: Once | RESPIRATORY_TRACT | Status: AC
  Administered 2023-07-06: 3 mL via RESPIRATORY_TRACT
  Filled 2023-07-06: qty 3

## 2023-07-06 MED ORDER — ALBUTEROL SULFATE HFA 108 (90 BASE) MCG/ACT IN AERS: 2.0000 | INHALATION_SPRAY | Freq: Four times a day (QID) | RESPIRATORY_TRACT | 2 refills | Status: DC | PRN

## 2023-07-06 NOTE — ED Triage Notes (Signed)
PT in with cough and congestion for 3 weeks.

## 2023-07-06 NOTE — ED Provider Triage Note (Signed)
Emergency Medicine Provider Triage Evaluation Note  Tiffany Hickman , a 42 y.o. female  was evaluated in triage.  Pt complains of cough, congestion x 3 weeks.  Hx of asthma.  Used inhaler 2 times this am  Review of Systems  Positive: SOB, cough Negative: No known fever  Physical Exam  BP 130/84 (BP Location: Left Arm)   Pulse (!) 102   Temp 98.4 F (36.9 C) (Oral)   Resp 18   Ht 5\' 6"  (1.676 m)   Wt 119.7 kg   LMP 06/22/2023 (Approximate)   SpO2 100%   BMI 42.61 kg/m  Gen:   Awake, no distress   Resp:  Normal effort   Poor air movement with occasional wheezing.  Able to answer questions with 3-4 words.  Horse voice.  Rare cough MSK:   Moves extremities without difficulty  Other:    Medical Decision Making  Medically screening exam initiated at 9:38 AM.  Appropriate orders placed.  Tiffany Hickman was informed that the remainder of the evaluation will be completed by another provider, this initial triage assessment does not replace that evaluation, and the importance of remaining in the ED until their evaluation is complete.     Tommi Rumps, PA-C 07/06/23 [phone]

## 2023-07-06 NOTE — ED Notes (Signed)
Says she feels much better and appears more relaxed.

## 2023-07-06 NOTE — ED Notes (Signed)
Says the coughing made her stomach and head hurt.

## 2023-07-06 NOTE — ED Provider Notes (Signed)
[facility] Provider Note    Event Date/Time   First MD Initiated Contact with Patient 07/06/23 1012     (approximate)   History   Cough   HPI  Tiffany Hickman is a 42 y.o. female with a history of CHF, hypertension who presents with complaints of cough, patient reports he felt mildly short of breath overnight due to repeated coughing as well feeling improved at this time.  No chest pain.  No fevers reported.  Symptoms been ongoing for about a week     Physical Exam   Triage Vital Signs: ED Triage Vitals  Encounter Vitals Group     BP 07/06/23 0937 130/84     Systolic BP Percentile --      Diastolic BP Percentile --      Pulse Rate 07/06/23 0937 (!) 102     Resp 07/06/23 0937 18     Temp 07/06/23 0934 98.4 F (36.9 C)     Temp Source 07/06/23 0934 Oral     SpO2 07/06/23 0937 100 %     Weight 07/06/23 0935 119.7 kg (264 lb)     Height 07/06/23 0935 1.676 m (5\' 6" )     Head Circumference --      Peak Flow --      Pain Score 07/06/23 0935 0     Pain Loc --      Pain Education --      Exclude from Growth Chart --     Most recent vital signs: Vitals:   07/06/23 0934 07/06/23 0937  BP:  130/84  Pulse:  (!) 102  Resp:  18  Temp: 98.4 F (36.9 C)   SpO2:  100%     General: Awake, no distress.  CV:  Good peripheral perfusion.  Resp:  Normal effort.  Scattered wheezes, no rales Abd:  No distention.  Other:     ED Results / Procedures / Treatments   Labs (all labs ordered are listed, but only abnormal results are displayed) Labs Reviewed  RESP PANEL BY RT-PCR (RSV, FLU A&B, COVID)  [id number]     EKG     RADIOLOGY Chest x-ray viewed interpret by me, no pneumonia    PROCEDURES:  Critical Care performed:   Procedures   MEDICATIONS ORDERED IN ED: Medications  predniSONE (DELTASONE) tablet 60 mg (60 mg Oral Given 07/06/23 1107)  ipratropium-albuterol (DUONEB) 0.5-2.5 (3) MG/3ML nebulizer solution 3 mL (3 mLs  Nebulization Given 07/06/23 1107)     IMPRESSION / MDM / ASSESSMENT AND PLAN / ED COURSE  I reviewed the triage vital signs and the nursing notes. Patient's presentation is most consistent with acute illness / injury with system symptoms.  Patient presents with cough as detailed above.  Differential includes bronchitis, URI, pneumonia, influenza  Respiratory PCR negative, chest x-ray without evidence of pneumonia.  Scattered wheezes on exam, treated with DuoNeb and prednisone with improvement, will prescribe prednisone course, outpatient follow-up with PCP        FINAL CLINICAL IMPRESSION(S) / ED DIAGNOSES   Final diagnoses:  Bronchitis     Rx / DC Orders   ED Discharge Orders          Ordered    predniSONE (DELTASONE) 50 MG tablet  Daily with breakfast        07/06/23 1157    albuterol (VENTOLIN HFA) 108 (90 Base) MCG/ACT inhaler  Every 6 hours PRN        07/06/23 1158  Note:  This document was prepared using Dragon voice recognition software and may include unintentional dictation errors.   Jene Every, MD 07/06/23 [phone]

## 2023-07-17 IMAGING — CR DG CHEST 2V
1 series · 2 of 2 positions shown · non-contrast
Comparison: 02/06/2020 chest radiograph.

CLINICAL DATA: Dyspnea

EXAM:
CHEST - 2 VIEW

[Series 1: dg chest 2 view · 0.14mm/px · 2 of 2 slices shown]
[im 1/2]
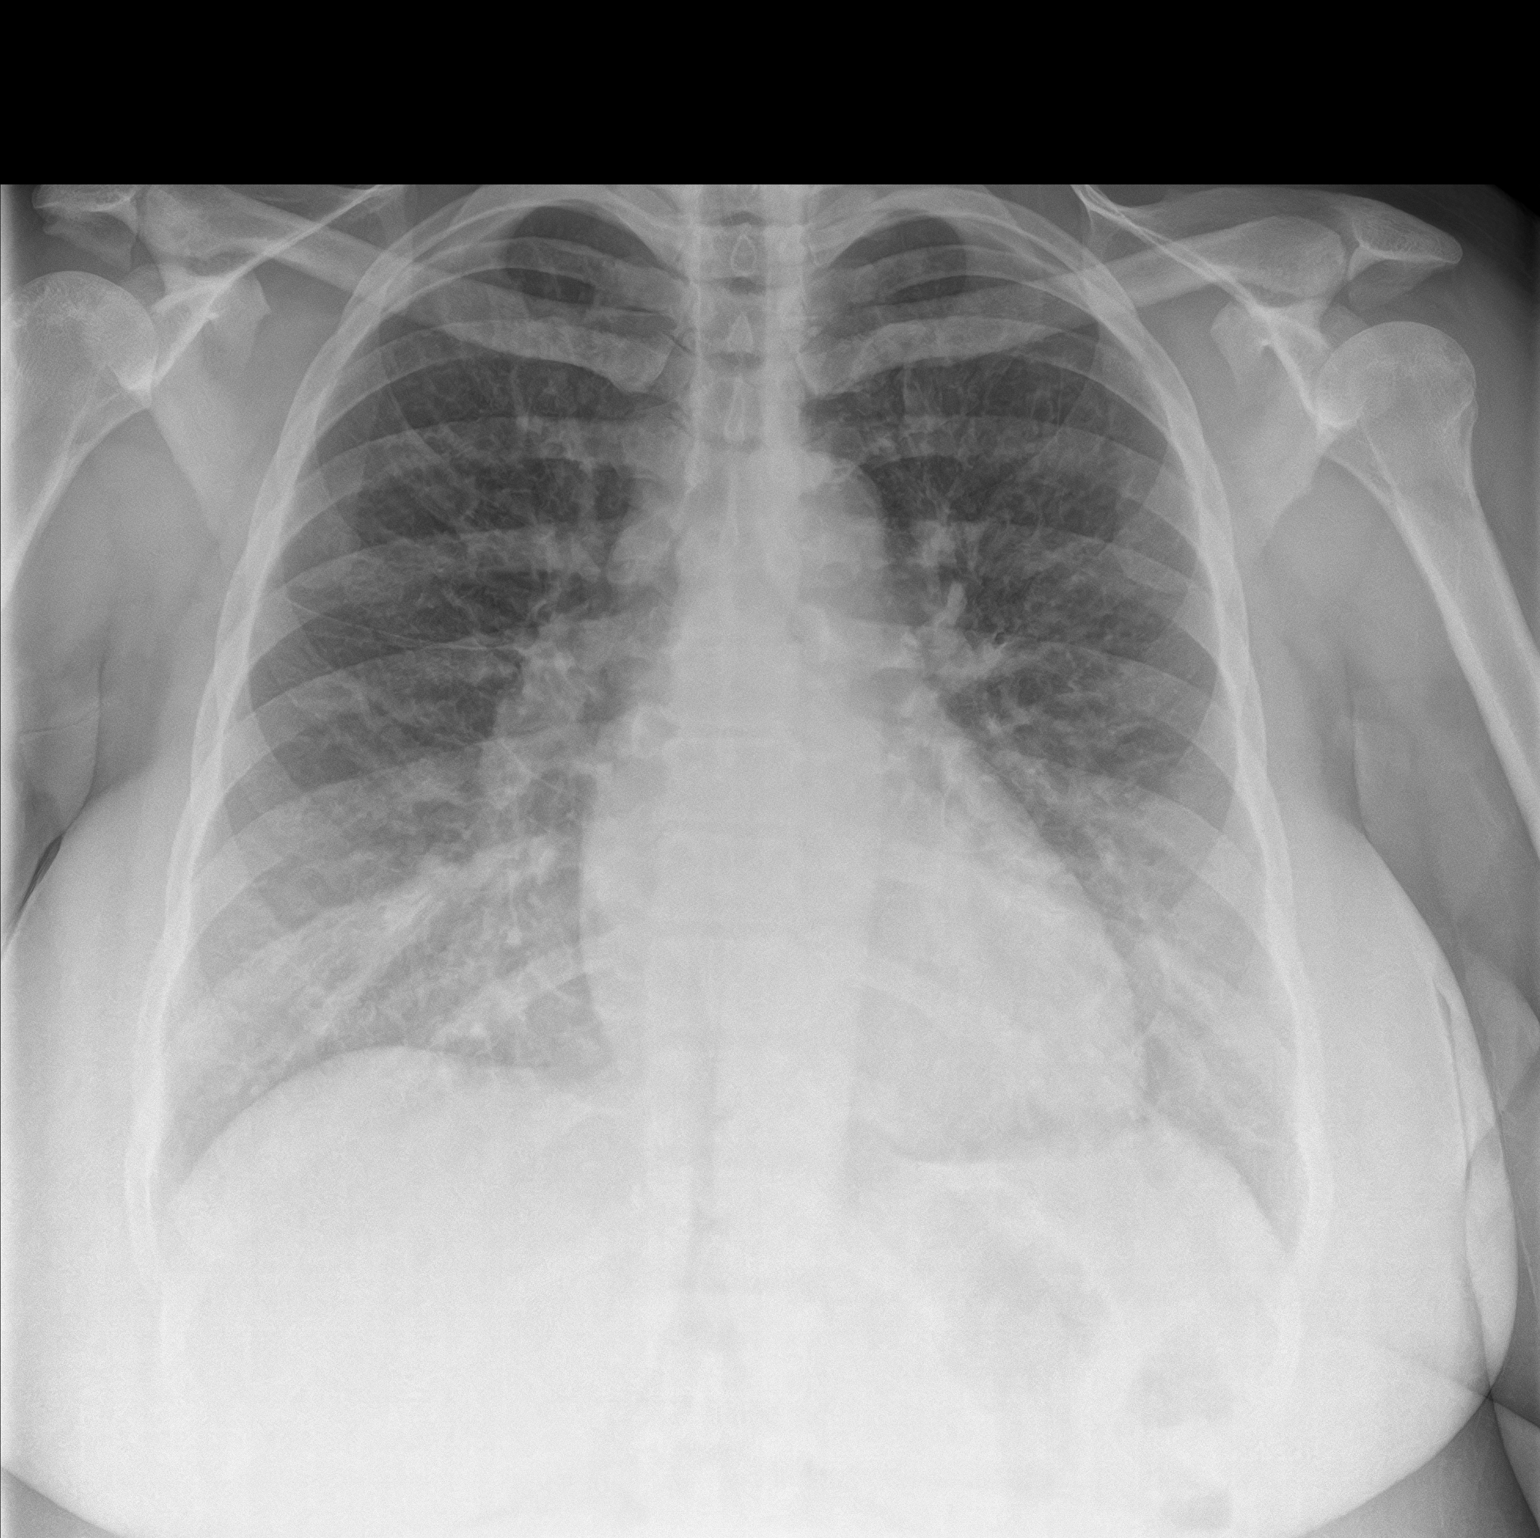
[im 2/2]
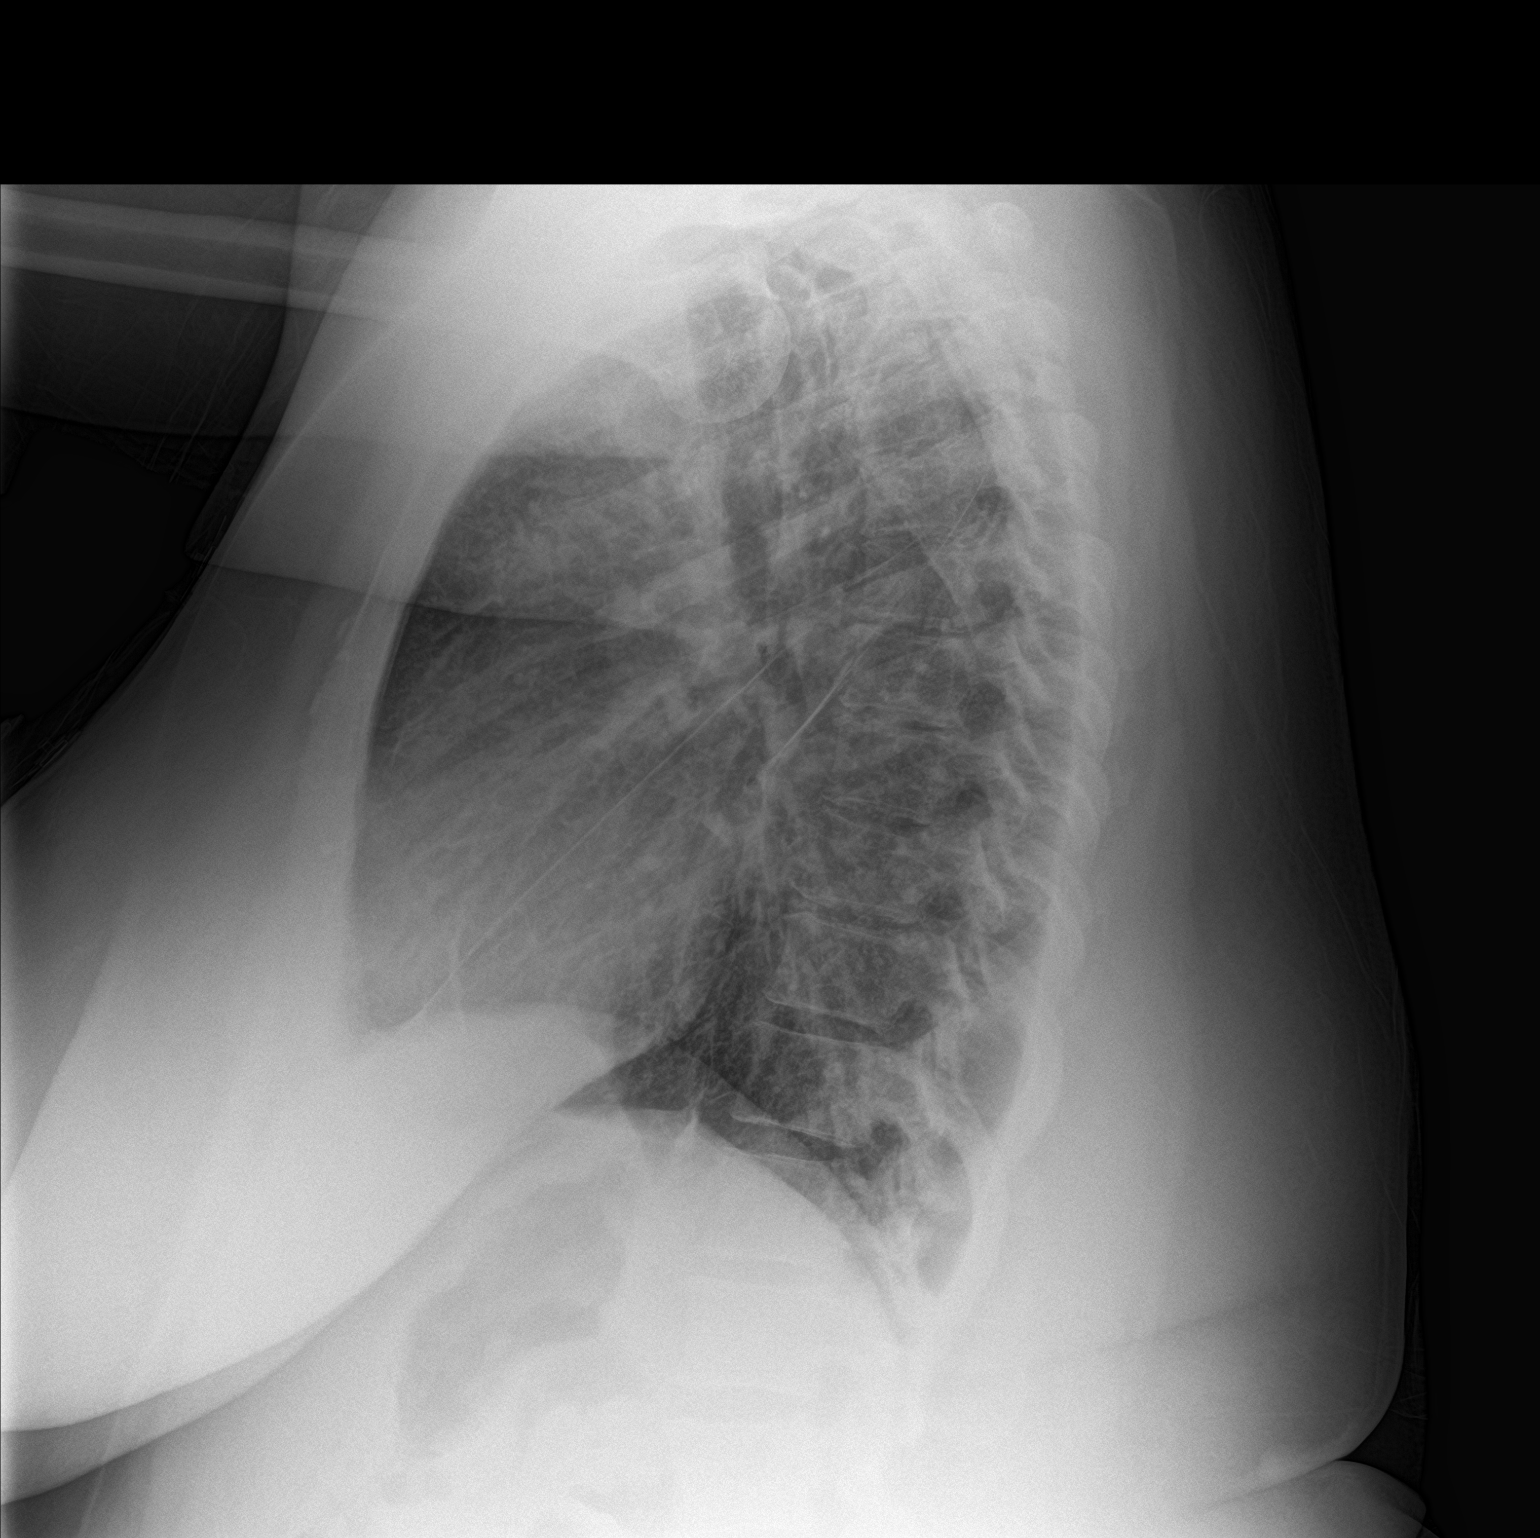

[2 of 2 positions shown; findings below may reference images not displayed]

FINDINGS: Stable cardiomediastinal silhouette with normal heart size. No
pneumothorax. No pleural effusion. No acute consolidative airspace
disease. Borderline mild prominence of the parahilar interstitial
markings.
IMPRESSION: Borderline mild prominence of the parahilar interstitial markings,
cannot exclude mild pulmonary edema.

## 2023-07-30 ENCOUNTER — Telehealth: Payer: Self-pay | Admitting: Cardiology

## 2023-07-30 NOTE — Telephone Encounter (Signed)
Attempted to call patient x2 to confirm Furosemide dosing.  Phone would ring 3 times then get a message "your call can not be completed at this time.  Please hang up and try your call again later".

## 2023-07-30 NOTE — Telephone Encounter (Signed)
Called patient. No answer and unable to leave a message.

## 2023-07-30 NOTE — Telephone Encounter (Signed)
*  STAT* If patient is at the pharmacy, call can be transferred to refill team.   1. Which medications need to be refilled? (please list name of each medication and dose if known) furosemide (LASIX) 20 MG tablet (Expired)   metoprolol succinate (TOPROL XL) 25 MG 24 hr tablet    losartan (COZAAR) 25 MG tablet    2. Which pharmacy/location (including street and city if local pharmacy) is medication to be sent to?  [id number] - [place], [place] - [address]    3. Do they need a 30 day or 90 day supply? 90

## 2023-07-30 NOTE — Telephone Encounter (Signed)
Patient calling in about her results. Please advise

## 2023-07-31 NOTE — Telephone Encounter (Signed)
Yeah I think that may be it.  Ill try to reach out to her between patients.

## 2023-08-07 ENCOUNTER — Other Ambulatory Visit: Payer: Self-pay | Admitting: Family Medicine

## 2023-08-07 DIAGNOSIS — Z1231 Encounter for screening mammogram for malignant neoplasm of breast: Secondary | ICD-10-CM

## 2023-08-08 ENCOUNTER — Emergency Department
Admission: EM | Admit: 2023-08-08 | Discharge: 2023-08-08 | Disposition: A | Discharge status: Home / Self Care | Payer: MEDICAID | Attending: Emergency Medicine | Admitting: Emergency Medicine

## 2023-08-08 ENCOUNTER — Other Ambulatory Visit: Payer: Self-pay

## 2023-08-08 DIAGNOSIS — I509 Heart failure, unspecified: Secondary | ICD-10-CM | POA: Insufficient documentation

## 2023-08-08 DIAGNOSIS — K0889 Other specified disorders of teeth and supporting structures: Secondary | ICD-10-CM | POA: Insufficient documentation

## 2023-08-08 DIAGNOSIS — I110 Hypertensive heart disease with heart failure: Secondary | ICD-10-CM | POA: Diagnosis not present

## 2023-08-08 MED ORDER — NAPROXEN 500 MG PO TABS: 500.0000 mg | ORAL_TABLET | Freq: Two times a day (BID) | ORAL | 0 refills | Status: AC

## 2023-08-08 MED ORDER — CHLORHEXIDINE GLUCONATE 0.12 % MT SOLN: 15.0000 mL | Freq: Two times a day (BID) | OROMUCOSAL | 0 refills | Status: AC

## 2023-08-08 MED ORDER — AMOXICILLIN-POT CLAVULANATE 875-125 MG PO TABS: 1.0000 | ORAL_TABLET | Freq: Two times a day (BID) | ORAL | 0 refills | Status: AC

## 2023-08-08 NOTE — ED Triage Notes (Signed)
Pt c/o left dental pain and swelling. Pt denies difficulty breathing. Pt is AOX4, NAD noted. Left side of mandible is visibly swollen.

## 2023-08-08 NOTE — ED Provider Notes (Signed)
[facility] Provider Note    Event Date/Time   First MD Initiated Contact with Patient 08/08/23 1519     (approximate)   History   Dental Pain   HPI  Tiffany Hickman is a 43 y.o. female who presents today for evaluation of left lower dental pain. Has a dentist but has not yet made an appointment. No fevers/chills. No difficulty swallowing. No facial or neck swelling. No trouble opening mouth. No fevers or chills.   Patient Active Problem List   Diagnosis Date Noted   Chronic heart failure with preserved ejection fraction (HFpEF) (HCC) 02/24/2019   Mitral valve disease 02/24/2019   Essential hypertension 02/24/2019          Physical Exam   Triage Vital Signs: ED Triage Vitals  Encounter Vitals Group     BP 08/08/23 1520 126/84     Systolic BP Percentile --      Diastolic BP Percentile --      Pulse Rate 08/08/23 1520 (!) 103     Resp 08/08/23 1520 18     Temp 08/08/23 1520 98.5 F (36.9 C)     Temp Source 08/08/23 1520 Oral     SpO2 08/08/23 1520 100 %     Weight --      Height --      Head Circumference --      Peak Flow --      Pain Score 08/08/23 1521 10     Pain Loc --      Pain Education --      Exclude from Growth Chart --     Most recent vital signs: Vitals:   08/08/23 1520  BP: 126/84  Pulse: (!) 103  Resp: 18  Temp: 98.5 F (36.9 C)  SpO2: 100%    Physical Exam Vitals and nursing note reviewed.  Constitutional:      General: Awake and alert. No acute distress.    Appearance: Normal appearance.  HENT:     Head: Normocephalic and atraumatic.     Mouth: Mucous membranes are moist. Poor dentition throughout. TTP along left lower gum line without focal area of swelling. No sublingual swelling. No trismus or drooling. Mild swelling along jaw but no erythema.No nuchal rigidity or erythema/swelling. Normal voice. Eyes:     General: PERRL. Normal EOMs        Right eye: No discharge.        Left eye: No  discharge.     Conjunctiva/sclera: Conjunctivae normal.  Cardiovascular:     Rate and Rhythm: Normal rate and regular rhythm.     Pulses: Normal pulses.  Pulmonary:     Effort: Pulmonary effort is normal. No respiratory distress.     Breath sounds: Normal breath sounds.  Abdominal:     Abdomen is soft. There is no abdominal tenderness. No rebound or guarding. No distention. Musculoskeletal:        General: No swelling. Normal range of motion.     Cervical back: Normal range of motion and neck supple.  Skin:    General: Skin is warm and dry.     Capillary Refill: Capillary refill takes less than 2 seconds.     Findings: No rash.  Neurological:     Mental Status: The patient is awake and alert.      ED Results / Procedures / Treatments   Labs (all labs ordered are listed, but only abnormal results are displayed) Labs Reviewed - No data to  display   EKG     RADIOLOGY     PROCEDURES:  Critical Care performed:   Procedures   MEDICATIONS ORDERED IN ED: Medications - No data to display   IMPRESSION / MDM / ASSESSMENT AND PLAN / ED COURSE  I reviewed the triage vital signs and the nursing notes.   Differential diagnosis includes, but is not limited to, dental caries, dental decay, abscess, pulpitis.  Patient was evaluated in the emergency department for dental pain. Patient has tenderness over her left lower teeth and poor dentition, I suspect some dental caries vs pulpitits vs gingivitis. No gingival swelling or fluctuance concerning for gingival abscess.  No trismus, nuchal rigidity, neck pain, hot potato voice, uvular deviation or malocclusion to suggest deep space infection. No sublingual swelling concerning for Ludwig's angina.  Patient was started on antibiotics and chlorhexidine mouth rinse.  Discussed care plan, return precautions, and advised close outpatient follow-up with dentist. Patient agrees with plan of care.   Patient's presentation is most  consistent with acute complicated illness / injury requiring diagnostic workup.      FINAL CLINICAL IMPRESSION(S) / ED DIAGNOSES   Final diagnoses:  Pain, dental     Rx / DC Orders   ED Discharge Orders          Ordered    amoxicillin-clavulanate (AUGMENTIN) 875-125 MG tablet  2 times daily        08/08/23 1520    chlorhexidine (PERIDEX) 0.12 % solution  2 times daily        08/08/23 1520    naproxen (NAPROSYN) 500 MG tablet  2 times daily with meals        08/08/23 1520             Note:  This document was prepared using Dragon voice recognition software and may include unintentional dictation errors.   Keturah Shavers 08/08/23 1738    Jene Every, MD 08/09/23 1124

## 2023-08-08 NOTE — Discharge Instructions (Addendum)
Please follow-up with your dentist.  Please take the medications as prescribed.  Please return for any new, worsening, or change in symptoms or other concerns.  It was a pleasure caring for you today.

## 2023-08-29 ENCOUNTER — Other Ambulatory Visit: Payer: Self-pay

## 2023-08-29 ENCOUNTER — Emergency Department: Payer: MEDICAID

## 2023-08-29 ENCOUNTER — Observation Stay
Admission: EM | Admit: 2023-08-29 | Discharge: 2023-08-30 | Disposition: A | Discharge status: Home / Self Care | Payer: MEDICAID | Attending: Internal Medicine | Admitting: Internal Medicine

## 2023-08-29 DIAGNOSIS — I110 Hypertensive heart disease with heart failure: Secondary | ICD-10-CM | POA: Insufficient documentation

## 2023-08-29 DIAGNOSIS — I10 Essential (primary) hypertension: Secondary | ICD-10-CM | POA: Diagnosis present

## 2023-08-29 DIAGNOSIS — Z7984 Long term (current) use of oral hypoglycemic drugs: Secondary | ICD-10-CM | POA: Diagnosis not present

## 2023-08-29 DIAGNOSIS — Z8673 Personal history of transient ischemic attack (TIA), and cerebral infarction without residual deficits: Secondary | ICD-10-CM | POA: Insufficient documentation

## 2023-08-29 DIAGNOSIS — I342 Nonrheumatic mitral (valve) stenosis: Secondary | ICD-10-CM | POA: Insufficient documentation

## 2023-08-29 DIAGNOSIS — R109 Unspecified abdominal pain: Secondary | ICD-10-CM | POA: Insufficient documentation

## 2023-08-29 DIAGNOSIS — D72829 Elevated white blood cell count, unspecified: Secondary | ICD-10-CM | POA: Diagnosis not present

## 2023-08-29 DIAGNOSIS — Z6841 Body Mass Index (BMI) 40.0 and over, adult: Secondary | ICD-10-CM | POA: Insufficient documentation

## 2023-08-29 DIAGNOSIS — Z79899 Other long term (current) drug therapy: Secondary | ICD-10-CM | POA: Insufficient documentation

## 2023-08-29 DIAGNOSIS — I5032 Chronic diastolic (congestive) heart failure: Secondary | ICD-10-CM | POA: Insufficient documentation

## 2023-08-29 DIAGNOSIS — F17210 Nicotine dependence, cigarettes, uncomplicated: Secondary | ICD-10-CM | POA: Diagnosis not present

## 2023-08-29 DIAGNOSIS — I050 Rheumatic mitral stenosis: Secondary | ICD-10-CM | POA: Diagnosis present

## 2023-08-29 DIAGNOSIS — R8281 Pyuria: Secondary | ICD-10-CM | POA: Insufficient documentation

## 2023-08-29 DIAGNOSIS — R1032 Left lower quadrant pain: Secondary | ICD-10-CM | POA: Diagnosis present

## 2023-08-29 DIAGNOSIS — J45909 Unspecified asthma, uncomplicated: Secondary | ICD-10-CM | POA: Diagnosis not present

## 2023-08-29 DIAGNOSIS — N280 Ischemia and infarction of kidney: Principal | ICD-10-CM

## 2023-08-29 LAB — CBC
HCT: 37 % (ref 36.0–46.0)
Hemoglobin: 11.2 g/dL — ABNORMAL LOW (ref 12.0–15.0)
MCH: 23.9 pg — ABNORMAL LOW (ref 26.0–34.0)
MCHC: 30.3 g/dL (ref 30.0–36.0)
MCV: 79.1 fL — ABNORMAL LOW (ref 80.0–100.0)
Platelets: 354 10*3/uL (ref 150–400)
RBC: 4.68 MIL/uL (ref 3.87–5.11)
RDW: 18.2 % — ABNORMAL HIGH (ref 11.5–15.5)
WBC: 11.5 10*3/uL — ABNORMAL HIGH (ref 4.0–10.5)
nRBC: 0 % (ref 0.0–0.2)

## 2023-08-29 LAB — URINALYSIS, ROUTINE W REFLEX MICROSCOPIC
Bacteria, UA: NONE SEEN
Bilirubin Urine: NEGATIVE
Glucose, UA: NEGATIVE mg/dL
Hgb urine dipstick: NEGATIVE
Ketones, ur: NEGATIVE mg/dL
Nitrite: NEGATIVE
Protein, ur: NEGATIVE mg/dL
Specific Gravity, Urine: 1.021 (ref 1.005–1.030)
pH: 6 (ref 5.0–8.0)

## 2023-08-29 LAB — PREGNANCY, URINE: Preg Test, Ur: NEGATIVE

## 2023-08-29 LAB — COMPREHENSIVE METABOLIC PANEL
ALT: 16 U/L (ref 0–44)
AST: 17 U/L (ref 15–41)
Albumin: 3.8 g/dL (ref 3.5–5.0)
Alkaline Phosphatase: 68 U/L (ref 38–126)
Anion gap: 8 (ref 5–15)
BUN: 13 mg/dL (ref 6–20)
CO2: 23 mmol/L (ref 22–32)
Calcium: 8.8 mg/dL — ABNORMAL LOW (ref 8.9–10.3)
Chloride: 106 mmol/L (ref 98–111)
Creatinine, Ser: 0.82 mg/dL (ref 0.44–1.00)
GFR, Estimated: >60 mL/min (ref >60)
Glucose, Bld: 94 mg/dL (ref 70–99)
Potassium: 4.1 mmol/L (ref 3.5–5.1)
Sodium: 137 mmol/L (ref 135–145)
Total Bilirubin: 0.4 mg/dL (ref 0.0–1.2)
Total Protein: 8.4 g/dL — ABNORMAL HIGH (ref 6.5–8.1)

## 2023-08-29 LAB — LIPASE, BLOOD: Lipase: 38 U/L (ref 11–51)

## 2023-08-29 MED ORDER — LOSARTAN POTASSIUM 25 MG PO TABS
25.0000 mg | ORAL_TABLET | Freq: Every day | ORAL | Status: DC
  Administered 2023-08-30: 25 mg via ORAL
  Filled 2023-08-29: qty 1

## 2023-08-29 MED ORDER — ASPIRIN 81 MG PO CHEW
81.0000 mg | CHEWABLE_TABLET | Freq: Once | ORAL | Status: AC
  Administered 2023-08-29: 81 mg via ORAL
  Filled 2023-08-29: qty 1

## 2023-08-29 MED ORDER — FUROSEMIDE 20 MG PO TABS: 20.0000 mg | ORAL_TABLET | Freq: Every evening | ORAL | Status: DC

## 2023-08-29 MED ORDER — ALBUTEROL SULFATE (2.5 MG/3ML) 0.083% IN NEBU: 2.5000 mg | INHALATION_SOLUTION | Freq: Four times a day (QID) | RESPIRATORY_TRACT | Status: DC | PRN

## 2023-08-29 MED ORDER — OXYCODONE HCL 5 MG PO TABS
5.0000 mg | ORAL_TABLET | Freq: Four times a day (QID) | ORAL | Status: AC | PRN
  Administered 2023-08-29 – 2023-08-30 (×2): 5 mg via ORAL
  Filled 2023-08-29 (×2): qty 1

## 2023-08-29 MED ORDER — ONDANSETRON HCL 4 MG/2ML IJ SOLN
4.0000 mg | Freq: Once | INTRAMUSCULAR | Status: DC
Start: 2023-08-29 — End: 2023-08-29

## 2023-08-29 MED ORDER — METOPROLOL SUCCINATE ER 25 MG PO TB24
25.0000 mg | ORAL_TABLET | Freq: Every day | ORAL | Status: DC
  Administered 2023-08-29 – 2023-08-30 (×2): 25 mg via ORAL
  Filled 2023-08-29 (×2): qty 1

## 2023-08-29 MED ORDER — ACETAMINOPHEN 650 MG RE SUPP: 650.0000 mg | Freq: Four times a day (QID) | RECTAL | Status: DC | PRN

## 2023-08-29 MED ORDER — ASPIRIN 81 MG PO TBEC
81.0000 mg | DELAYED_RELEASE_TABLET | Freq: Every day | ORAL | Status: DC
  Administered 2023-08-30: 81 mg via ORAL
  Filled 2023-08-29: qty 1

## 2023-08-29 MED ORDER — ONDANSETRON HCL 4 MG/2ML IJ SOLN: 4.0000 mg | Freq: Four times a day (QID) | INTRAMUSCULAR | Status: DC | PRN

## 2023-08-29 MED ORDER — FENTANYL CITRATE PF 50 MCG/ML IJ SOSY: 50.0000 ug | PREFILLED_SYRINGE | Freq: Once | INTRAMUSCULAR | Status: DC

## 2023-08-29 MED ORDER — FUROSEMIDE 40 MG PO TABS: 40.0000 mg | ORAL_TABLET | ORAL | Status: DC

## 2023-08-29 MED ORDER — IOHEXOL 300 MG/ML  SOLN
100.0000 mL | Freq: Once | INTRAMUSCULAR | Status: AC | PRN
  Administered 2023-08-29: 100 mL via INTRAVENOUS

## 2023-08-29 MED ORDER — GUAIFENESIN 100 MG/5ML PO LIQD: 5.0000 mL | ORAL | Status: DC | PRN

## 2023-08-29 MED ORDER — OXYCODONE HCL 5 MG PO TABS
5.0000 mg | ORAL_TABLET | Freq: Once | ORAL | Status: AC
  Administered 2023-08-29: 5 mg via ORAL
  Filled 2023-08-29: qty 1

## 2023-08-29 MED ORDER — ONDANSETRON HCL 4 MG PO TABS
4.0000 mg | ORAL_TABLET | Freq: Four times a day (QID) | ORAL | Status: DC | PRN
Start: 2023-08-29 — End: 2023-09-03

## 2023-08-29 MED ORDER — ACETAMINOPHEN 500 MG PO TABS
1000.0000 mg | ORAL_TABLET | Freq: Once | ORAL | Status: AC
  Administered 2023-08-29: 1000 mg via ORAL
  Filled 2023-08-29: qty 2

## 2023-08-29 MED ORDER — MORPHINE SULFATE (PF) 4 MG/ML IV SOLN: 4.0000 mg | INTRAVENOUS | Status: AC | PRN

## 2023-08-29 MED ORDER — FUROSEMIDE 40 MG PO TABS
40.0000 mg | ORAL_TABLET | Freq: Every day | ORAL | Status: DC
  Administered 2023-08-30: 40 mg via ORAL
  Filled 2023-08-29: qty 1

## 2023-08-29 MED ORDER — ACETAMINOPHEN 325 MG PO TABS
650.0000 mg | ORAL_TABLET | Freq: Four times a day (QID) | ORAL | Status: DC | PRN
  Administered 2023-08-29: 650 mg via ORAL
  Filled 2023-08-29: qty 2

## 2023-08-29 MED ORDER — SENNOSIDES-DOCUSATE SODIUM 8.6-50 MG PO TABS: 1.0000 | ORAL_TABLET | Freq: Every evening | ORAL | Status: DC | PRN

## 2023-08-29 MED ORDER — HEPARIN SODIUM (PORCINE) 5000 UNIT/ML IJ SOLN
5000.0000 [IU] | Freq: Three times a day (TID) | INTRAMUSCULAR | Status: DC
  Administered 2023-08-29 – 2023-08-30 (×2): 5000 [IU] via SUBCUTANEOUS
  Filled 2023-08-29 (×2): qty 1

## 2023-08-29 MED ORDER — HYDRALAZINE HCL 20 MG/ML IJ SOLN: 5.0000 mg | Freq: Four times a day (QID) | INTRAMUSCULAR | Status: DC | PRN

## 2023-08-29 NOTE — Assessment & Plan Note (Addendum)
 Mild, 11.5 on admission Recheck CBC in a.m.

## 2023-08-29 NOTE — Assessment & Plan Note (Addendum)
 Present on admission With elevated squamous epithelial cells and no urinary symptoms Low clinical suspicion for UTI Antibiotic not initiated Recheck CBC in the a.m.

## 2023-08-29 NOTE — ED Triage Notes (Signed)
 Pt c/o LLQ pain x2 days, no other symptoms. Pt reports hx of CHF.

## 2023-08-29 NOTE — ED Provider Notes (Signed)
 [facility] Provider Note    Event Date/Time   First MD Initiated Contact with Patient 08/29/23 1145     (approximate)   History   Abdominal Pain   HPI  Tiffany Hickman is a 43 y.o. female with history of CHF who comes in with concerns for abdominal pain.  To note patient is on Lasix reports good fluid balance lately.  She comes in for left lower quadrant abdominal pain.  She reports that been going on for 2 days.  Denies any urinary symptoms.  Has not been sexually active in over a year denies any vaginal discharge.  Uses pads.  Denies any prior issues with her ovaries.  States that this happened 1 time over a year ago but the pain went away after a day and since this pain is now been going on for 2 days she wanted to be evaluated.  She reports the pain is constant.  Denies any urinary symptoms.  Denies any concerns for STDs.   Physical Exam   Triage Vital Signs: ED Triage Vitals  Encounter Vitals Group     BP 08/29/23 1142 (!) 160/92     Systolic BP Percentile --      Diastolic BP Percentile --      Pulse Rate 08/29/23 1142 94     Resp 08/29/23 1142 16     Temp 08/29/23 1142 98.4 F (36.9 C)     Temp Source 08/29/23 1142 Oral     SpO2 08/29/23 1142 100 %     Weight 08/29/23 1143 269 lb (122 kg)     Height 08/29/23 1143 5\' 6"  (1.676 m)     Head Circumference --      Peak Flow --      Pain Score 08/29/23 1143 8     Pain Loc --      Pain Education --      Exclude from Growth Chart --     Most recent vital signs: Vitals:   08/29/23 1142  BP: (!) 160/92  Pulse: 94  Resp: 16  Temp: 98.4 F (36.9 C)  SpO2: 100%     General: Awake, no distress.  CV:  Good peripheral perfusion.  Resp:  Normal effort.  Abd:  No distention.  Tender in the left mid abdomen.  No significant lower pelvic tenderness. Other:     ED Results / Procedures / Treatments   Labs (all labs ordered are listed, but only abnormal results are displayed) Labs  Reviewed  LIPASE, BLOOD  COMPREHENSIVE METABOLIC PANEL  CBC  URINALYSIS, ROUTINE W REFLEX MICROSCOPIC  POC URINE PREG, ED      RADIOLOGY I have reviewed the CT personally interpreted and no kidney stones  PROCEDURES:  Critical Care performed: No  Procedures   MEDICATIONS ORDERED IN ED: Medications  acetaminophen (TYLENOL) tablet 650 mg (has no administration in time range)    Or  acetaminophen (TYLENOL) suppository 650 mg (has no administration in time range)  ondansetron (ZOFRAN) tablet 4 mg (has no administration in time range)    Or  ondansetron (ZOFRAN) injection 4 mg (has no administration in time range)  heparin injection 5,000 Units (has no administration in time range)  senna-docusate (Senokot-S) tablet 1 tablet (has no administration in time range)  hydrALAZINE (APRESOLINE) injection 5 mg (has no administration in time range)  oxyCODONE (Oxy IR/ROXICODONE) immediate release tablet 5 mg (5 mg Oral Given 08/29/23 1732)  morphine (PF) 4 MG/ML injection 4 mg (has  no administration in time range)  acetaminophen (TYLENOL) tablet 1,000 mg (1,000 mg Oral Given 08/29/23 1307)  iohexol (OMNIPAQUE) 300 MG/ML solution 100 mL (100 mLs Intravenous Contrast Given 08/29/23 1401)  oxyCODONE (Oxy IR/ROXICODONE) immediate release tablet 5 mg (5 mg Oral Given 08/29/23 1538)  aspirin chewable tablet 81 mg (81 mg Oral Given 08/29/23 1538)     IMPRESSION / MDM / ASSESSMENT AND PLAN / ED COURSE  I reviewed the triage vital signs and the nursing notes.   Patient's presentation is most consistent with acute presentation with potential threat to life or bodily function.   Differential includes diverticulitis, kidney stone, seems less likely to be ovarian pathology, PID based upon history.  Discussed with patient starting off with ultrasound for CT scan but given I have higher suspicion for the above we will start off with CT scan to further evaluate.  Patient given some IV fentanyl IV Zofran to  help with symptoms while awaiting results.  Pregnancy test was negative lipase was normal CMP was reassuring CBC shows slightly elevated white count hemoglobin similar to priors.  Urine without evidence of UTI.  Patient declined IV fentanyl and preferred to surround with some Tylenol.  IMPRESSION: 1. Wedge-shaped left lower pole hypoattenuation, which may represent pyelonephritis or renal infarction. Recommend correlation with urinalysis. Consider nonemergent follow-up renal protocol MRI or CT abdomen to ensure resolution and exclude underlying renal lesion. 2. Bilateral inguinal lymphadenopathy, likely reactive. Recommend correlation with physical exam findings of the lower extremities. 3.  Aortic Atherosclerosis (ICD10-I70.0).    Her urine does not have evidence of UTI to suggest this being an infection therefore I will discuss with the hospitalist for admission for infarct workup including EKG, cholesterol, echo, CT aorta chest abdomen pelvis to try to figure out why this happened.  Ordered for tomorrow given recent contrast.  Patient still having pain and was given a dose of oxycodone.  I did message vascular about this patient as well so they are aware.  I did examine her legs and she denies any symptoms or skin concerns for infections.  She did not want to take her socks off but she was adamant that there were no infections on her feet.  I have discussed the case with the hospitalist for admission    Clinical Course as of 08/29/23 1526  Sat Aug 29, 2023  1400 CT ABDOMEN PELVIS W CONTRAST [BK]    Clinical Course User Index [BK] Elon Jester, [place], [place]     FINAL CLINICAL IMPRESSION(S) / ED DIAGNOSES   Final diagnoses:  Kidney infarction [facility])     Rx / DC Orders   ED Discharge Orders     None        Note:  This document was prepared using Dragon voice recognition software and may include unintentional dictation errors.   Concha Se, MD 08/29/23  [phone]

## 2023-08-29 NOTE — Assessment & Plan Note (Addendum)
 Home losartan 25 mg, metoprolol succinate 25 mg daily, home furosemide dosing were resumed on admission Hydralazine 5 mg IV every 6 hours as needed for SBP greater 165, 5 days ordered

## 2023-08-29 NOTE — Assessment & Plan Note (Signed)
 -  This complicates overall care and prognosis.

## 2023-08-29 NOTE — ED Notes (Signed)
 Patient taken to CT scan.

## 2023-08-29 NOTE — Assessment & Plan Note (Signed)
 Continue outpatient follow-up with cardiothoracic surgeon as referred to by cardiologist

## 2023-08-29 NOTE — Assessment & Plan Note (Signed)
 Symptomatic support: Oxycodone 5 mg p.o. every 6 hours as needed for moderate pain, 1 day ordered; morphine 4 mg IV every 4 hours as needed for severe pain, 1 day ordered

## 2023-08-29 NOTE — H&P (Signed)
 History and Physical   Tiffany Hickman WUJ:[id number] DOB: 1980-11-28 DOA: 08/29/2023  PCP: [facility]  Outpatient Specialists: Dr. Azucena Cecil, [facility] cardiologist Patient coming from: Home  I have personally briefly reviewed patient's old medical records in [facility] EMR.  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  Vitals in the ED showed temperature 98.4, respiratory rate 16, heart rate of 94, blood pressure 160/92, SpO2 100% on room air.  Serum sodium is 137, potassium 4.1, chloride 106, bicarb 23, BUN of 13, serum creatinine of 2.82, EGFR greater than 60, nonfasting blood glucose 94, WBC 11.5, hemoglobin 11.2, platelets of 354.  COVID/influenza A/influenza B/RSV PCR were negative.  UA was positive for trace leukocytes and squamous epithelial cell was present, 6-10.  No bacteria was seen.  No WBC in the urine.  ED treatment: Tylenol 1000 mg p.o. one-time dose, aspirin 81 mg p.o. one-time dose, oxycodone 5 mg p.o. one-time dose. -------------------------------- At bedside, patient is able to tell me her first and last name, age, location, current calendar year.  She reports yesterday, she developed a persistent menstrual pain like feeling in her left side of her abdomen.  She reports she felt this similar sensation a year ago however it did not last very long and quickly went away.  She denies trauma to her person, changes to her diet, sexual activity.  She reports she has not been sexually active for over a year.  She denies urinary/vaginal discharge.  She denies dysuria, hematuria, blood in her stool, diarrhea, syncope, loss of consciousness,  swelling of the lower extremities.  She denies facial droop, difficulty walking, or difficulty speaking.  Social history: She is at home with her children.  She currently smokes 3 to 4 cigarettes/day and she is trying to quit.  She denies EtOH and recreational drug use.  ROS: Constitutional: no weight change, no fever ENT/Mouth: no sore throat, no rhinorrhea Eyes: no eye pain, no vision changes Cardiovascular: no chest pain, no dyspnea,  no edema, no palpitations Respiratory: no cough, no sputum, no wheezing Gastrointestinal: no nausea, no vomiting, no diarrhea, no constipation Genitourinary: no urinary incontinence, no dysuria, no hematuria Musculoskeletal: no arthralgias, no myalgias Skin: no skin lesions, no pruritus, Neuro: + weakness, no loss of consciousness, no syncope Psych: no anxiety, no depression, + decrease appetite Heme/Lymph: no bruising, no bleeding  ED Course: Discussed with EDP, patient requiring hospitalization for chief concerns of left renal infarct.  Assessment/Plan  Principal Problem:   Renal infarct [facility]) Active Problems:   Mitral valve stenosis   Essential hypertension   Leukocytosis   Obesity, Class III, BMI 40-49.9 (morbid obesity) (HCC)   Abdominal pain   Pyuria   Assessment and Plan:  * Renal infarct [facility]) CTA chest abdomen pelvis for dissection with and without contrast material has been ordered by EDP Status post aspirin 81 mg one-time dose ordered per EDP Admit to telemetry cardiac, observation for monitoring of possible atrial fibrillation Pending monitoring, patient may need to be sent home with Holter monitor No renal revascularization indicated at this time, wedge-shaped left lower pole hypoattenuation, unclear duration of infarct Aspirin 81 mg daily initiated for 08/30/2023  Pyuria Present on admission With elevated squamous epithelial cells and no urinary  symptoms Low clinical suspicion for UTI Antibiotic not initiated Recheck CBC in  the a.m.  Abdominal pain Symptomatic support: Oxycodone 5 mg p.o. every 6 hours as needed for moderate pain, 1 day ordered; morphine 4 mg IV every 4 hours as needed for severe pain, 1 day ordered  Obesity, Class III, BMI 40-49.9 (morbid obesity) (HCC) This complicates overall care and prognosis.   Leukocytosis Mild, 11.5 on admission Recheck CBC in a.m.  Essential hypertension Home losartan 25 mg, metoprolol succinate 25 mg daily, home furosemide dosing were resumed on admission Hydralazine 5 mg IV every 6 hours as needed for SBP greater 165, 5 days ordered  Mitral valve stenosis Continue outpatient follow-up with cardiothoracic surgeon as referred to by cardiologist  Chart reviewed.   DVT prophylaxis: Heparin 5000 units subcutaneous every 8 hours Code Status: Full code Diet: Heart healthy Family Communication: No Disposition Plan: Pending clinical course Consults called: None at this time AM team to review CTA chest abdomen pelvis for appropriate consultation service Admission status: Telemetry cardiac, observation  Past Medical History:  Diagnosis Date   Abscess, peritonsillar    Asthma    Back pain    Chronic diastolic heart failure (HCC)    Cubital tunnel syndrome    DOE (dyspnea on exertion)    Hidradenitis suppurativa    Hypertension    Hypertension    Major depressive disorder, recurrent episode, moderate (HCC)    Meningitis    as an infant   Mitral stenosis    Patellar tendinitis    Prediabetes    Stroke (HCC)    Unspecified disorder of binocular vision    Past Surgical History:  Procedure Laterality Date   SKIN GRAFT     LEFT FOOT   SKIN GRAFT Left    as an infant: Left foot   TEE WITHOUT CARDIOVERSION N/A 01/01/2023   Procedure: TRANSESOPHAGEAL ECHOCARDIOGRAM (TEE);  Surgeon: Debbe Odea, MD;  Location: [facility] ORS;  Service: Cardiovascular;  Laterality: N/A;   Social History:  reports that she has been smoking cigarettes. She has never used  smokeless tobacco. She reports that she does not currently use alcohol. She reports that she does not use drugs.  No Known Allergies Family History  Problem Relation Age of Onset   Diabetes Mother    Hypertension Mother    Hypertension Father    COPD Father    Family history: Family history reviewed and not pertinent.  Prior to Admission medications   Medication Sig Start Date End Date Taking? Authorizing Provider  albuterol (VENTOLIN HFA) 108 (90 Base) MCG/ACT inhaler Inhale 2 puffs into the lungs every 6 (six) hours as needed for wheezing or shortness of breath. 05/25/22  Yes Sharyn Creamer, MD  chlorhexidine (PERIDEX) 0.12 % solution Use as directed 15 mLs in the mouth or throat 2 (two) times daily. 08/08/23  Yes Poggi, Eileen Stanford E, PA-C  fluticasone (FLOVENT HFA) 110 MCG/ACT inhaler Inhale 2 puffs into the lungs daily. 06/19/23  Yes [provider]  furosemide (LASIX) 20 MG tablet Take 2 tablets (40 mg total) by mouth 2 (two) times daily. Take 40mg  (2 tablets) in am and 20mg  (1 tablet) in pm 01/01/23 08/29/23 Yes Agbor-Etang, Arlys John, MD  losartan (COZAAR) 25 MG tablet Take 1 tablet (25 mg total) by mouth daily. 12/15/22  Yes Debbe Odea, MD  metoprolol succinate (TOPROL XL) 25 MG 24 hr tablet Take 1 tablet (25 mg total) by mouth daily. 12/15/22  Yes Debbe Odea, MD  potassium chloride SA (KLOR-CON  M) 20 MEQ tablet Take 1 tablet (20 mEq total) by mouth daily. 12/15/22 08/29/23 Yes Agbor-Etang, Arlys John, MD  Vitamin D, Ergocalciferol, (DRISDOL) 1.25 MG (50000 UNIT) CAPS capsule Take 50,000 Units by mouth once a week. 10/14/22  Yes [provider]  azithromycin (ZITHROMAX Z-PAK) 250 MG tablet Take 2 tablets (500 mg) on  Day 1,  followed by 1 tablet (250 mg) once daily on Days 2 through 5. Patient not taking: Reported on 11/21/2022 01/27/20   Sharman Cheek, MD  benzonatate (TESSALON PERLES) 100 MG capsule Take 1 capsule (100 mg total) by mouth every 6 (six) hours as needed for  cough. Patient not taking: Reported on 11/21/2022 05/25/22   Sharyn Creamer, MD  doxycycline (VIBRAMYCIN) 100 MG capsule Take 1 capsule (100 mg total) by mouth 2 (two) times daily. Patient not taking: Reported on 11/21/2022 09/29/19   Tommi Rumps, PA-C  guaiFENesin (ROBITUSSIN) 100 MG/5ML SOLN Take 5 mLs (100 mg total) by mouth every 4 (four) hours as needed for cough or to loosen phlegm. Patient not taking: Reported on 11/21/2022 01/27/20   Sharman Cheek, MD  hydrochlorothiazide (HYDRODIURIL) 25 MG tablet Take 25 mg by mouth daily. Patient not taking: Reported on 08/29/2023    [provider]  metFORMIN (GLUCOPHAGE) 500 MG tablet Take 500 mg by mouth daily with breakfast. Patient not taking: Reported on 11/21/2022    [provider]  predniSONE (DELTASONE) 50 MG tablet Take 1 tablet (50 mg total) by mouth daily with breakfast. Patient not taking: Reported on 08/29/2023 07/06/23   Jene Every, MD  traMADol (ULTRAM) 50 MG tablet Take 1 tablet (50 mg total) by mouth every 6 (six) hours as needed. Patient not taking: Reported on 12/15/2022 09/29/19   Tommi Rumps, PA-C   Physical Exam: Vitals:   08/29/23 1530 08/29/23 1545 08/29/23 1600 08/29/23 1722  BP: (!) 140/90  118/81 (!) 142/77  Pulse: 84 85 83 87  Resp:   (!) 21 18  Temp:    99.2 F (37.3 C)  TempSrc:    Oral  SpO2: 100% 98% 98% 100%  Weight:      Height:       Constitutional: appears older than chronological age, chronically ill Eyes: PERRL, lids and conjunctivae normal ENMT: Mucous membranes are moist. Posterior pharynx clear of any exudate or lesions. Age-appropriate dentition. Hearing appropriate Neck: normal, supple, no masses, no thyromegaly Respiratory: clear to auscultation bilaterally, no wheezing, no crackles. Normal respiratory effort. No accessory muscle use.  Cardiovascular: Regular rate and rhythm, no murmurs / rubs / gallops. No extremity edema. 2+ pedal pulses. No carotid bruits.  Abdomen:  Abdomen, no tenderness, no masses palpated, no hepatosplenomegaly. Bowel sounds positive.  Musculoskeletal: no clubbing / cyanosis. No joint deformity upper and lower extremities. Good ROM, no contractures, no atrophy. Normal muscle tone.  Skin: no rashes, lesions, ulcers. No induration Neurologic: Sensation intact. Strength 5/5 in all 4.  Psychiatric: Normal judgment and insight. Alert and oriented x 3. Normal mood.   EKG: independently reviewed, showing sinus rhythm with rate of 78, QTc 458  Chest x-ray on Admission: I personally reviewed and I agree with radiologist reading as below.  CT ABDOMEN PELVIS W CONTRAST Result Date: 08/29/2023 CLINICAL DATA:  Two day history of left lower quadrant pain EXAM: CT ABDOMEN AND PELVIS WITH CONTRAST TECHNIQUE: Multidetector CT imaging of the abdomen and pelvis was performed using the standard protocol following bolus administration of intravenous contrast. RADIATION DOSE REDUCTION: This exam was performed according  to the departmental dose-optimization program which includes automated exposure control, adjustment of the mA and/or kV according to patient size and/or use of iterative reconstruction technique. CONTRAST:  OMNIPAQUE IOHEXOL 300 MG/ML  SOLN COMPARISON:  None Available. FINDINGS: Lower chest: No focal consolidation or pulmonary nodule in the lung bases. No pleural effusion or pneumothorax demonstrated. Partially imaged heart size is normal. Hepatobiliary: No focal hepatic lesions. No intra or extrahepatic biliary ductal dilation. Normal gallbladder. Pancreas: No focal lesions or main ductal dilation. Spleen: Normal in size without focal abnormality. Adrenals/Urinary Tract: No adrenal nodules. Left interpolar anterior renal cortical scarring. Wedge-shaped left lower pole hypoattenuation (5:67). No hydronephrosis or calculi. No focal bladder wall thickening. Stomach/Bowel: Normal appearance of the stomach. No evidence of bowel wall thickening,  distention, or inflammatory changes. Normal appendix. Vascular/Lymphatic: Aortic atherosclerosis. Bilateral inguinal lymphadenopathy measuring up to 11 mm on the right (2:68). Reproductive: Peripherally enhancing hypoechogenicity in the left adnexa, likely corpus luteum. No right adnexal masses. Other: Small volume pelvic free fluid. No free air or fluid collection. Musculoskeletal: No acute or abnormal lytic or blastic osseous lesions. IMPRESSION: 1. Wedge-shaped left lower pole hypoattenuation, which may represent pyelonephritis or renal infarction. Recommend correlation with urinalysis. Consider nonemergent follow-up renal protocol MRI or CT abdomen to ensure resolution and exclude underlying renal lesion. 2. Bilateral inguinal lymphadenopathy, likely reactive. Recommend correlation with physical exam findings of the lower extremities. 3.  Aortic Atherosclerosis (ICD10-I70.0). Electronically Signed   By: Agustin Cree M.D.   On: 08/29/2023 14:39   Labs on Admission: I have personally reviewed following labs  CBC: Recent Labs  Lab 08/29/23 1232  WBC 11.5*  HGB 11.2*  HCT 37.0  MCV 79.1*  PLT 354   Basic Metabolic Panel: Recent Labs  Lab 08/29/23 1232  NA 137  K 4.1  CL 106  CO2 23  GLUCOSE 94  BUN 13  CREATININE 0.82  CALCIUM 8.8*   GFR: Estimated Creatinine Clearance: 117.9 mL/min (by C-G formula based on SCr of 0.82 mg/dL).  Liver Function Tests: Recent Labs  Lab 08/29/23 1232  AST 17  ALT 16  ALKPHOS 68  BILITOT 0.4  PROT 8.4*  ALBUMIN 3.8   Recent Labs  Lab 08/29/23 1232  LIPASE 38   Urine analysis:    Component Value Date/Time   COLORURINE YELLOW (A) 08/29/2023 1232   APPEARANCEUR HAZY (A) 08/29/2023 1232   LABSPEC 1.021 08/29/2023 1232   PHURINE 6.0 08/29/2023 1232   GLUCOSEU NEGATIVE 08/29/2023 1232   HGBUR NEGATIVE 08/29/2023 1232   BILIRUBINUR NEGATIVE 08/29/2023 1232   KETONESUR NEGATIVE 08/29/2023 1232   PROTEINUR NEGATIVE 08/29/2023 1232   NITRITE  NEGATIVE 08/29/2023 1232   LEUKOCYTESUR TRACE (A) 08/29/2023 1232   This document was prepared using Dragon Voice Recognition software and may include unintentional dictation errors.  Dr. Sedalia Muta [facility]  If 7PM-7AM, please contact overnight-coverage provider If 7AM-7PM, please contact day attending provider [email or web address]  08/29/2023, 7:22 PM

## 2023-08-29 NOTE — Assessment & Plan Note (Addendum)
 CTA chest abdomen pelvis for dissection with and without contrast material has been ordered by EDP Status post aspirin 81 mg one-time dose ordered per EDP Admit to telemetry cardiac, observation for monitoring of possible atrial fibrillation Pending monitoring, patient may need to be sent home with Holter monitor No renal revascularization indicated at this time, wedge-shaped left lower pole hypoattenuation, unclear duration of infarct Aspirin 81 mg daily initiated for 08/30/2023

## 2023-08-29 NOTE — ED Notes (Signed)
 Patient given saltines and graham crackers to eat prior to medication administration.

## 2023-08-29 NOTE — Hospital Course (Signed)
 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  Vitals in the ED showed temperature 98.4, respiratory rate 16, heart rate of 94, blood pressure 160/92, SpO2 100% on room air.  Serum sodium is 137, potassium 4.1, chloride 106, bicarb 23, BUN of 13, serum creatinine of 2.82, EGFR greater than 60, nonfasting blood glucose 94, WBC 11.5, hemoglobin 11.2, platelets of 354.  COVID/influenza A/influenza B/RSV PCR were negative.  UA was positive for trace leukocytes and squamous epithelial cell was present, 6-10.  No bacteria was seen.  No WBC in the urine.  ED treatment: Tylenol 1000 mg p.o. one-time dose, aspirin 81 mg p.o. one-time dose, oxycodone 5 mg p.o. one-time dose.

## 2023-08-30 ENCOUNTER — Observation Stay: Payer: MEDICAID

## 2023-08-30 DIAGNOSIS — N280 Ischemia and infarction of kidney: Secondary | ICD-10-CM | POA: Diagnosis not present

## 2023-08-30 LAB — BASIC METABOLIC PANEL
Anion gap: 7 (ref 5–15)
BUN: 15 mg/dL (ref 6–20)
CO2: 21 mmol/L — ABNORMAL LOW (ref 22–32)
Calcium: 8.7 mg/dL — ABNORMAL LOW (ref 8.9–10.3)
Chloride: 105 mmol/L (ref 98–111)
Creatinine, Ser: 0.83 mg/dL (ref 0.44–1.00)
GFR, Estimated: >60 mL/min (ref >60)
Glucose, Bld: 96 mg/dL (ref 70–99)
Potassium: 4 mmol/L (ref 3.5–5.1)
Sodium: 133 mmol/L — ABNORMAL LOW (ref 135–145)

## 2023-08-30 LAB — CBC
HCT: 32 % — ABNORMAL LOW (ref 36.0–46.0)
Hemoglobin: 9.9 g/dL — ABNORMAL LOW (ref 12.0–15.0)
MCH: 23.7 pg — ABNORMAL LOW (ref 26.0–34.0)
MCHC: 30.9 g/dL (ref 30.0–36.0)
MCV: 76.7 fL — ABNORMAL LOW (ref 80.0–100.0)
Platelets: 330 10*3/uL (ref 150–400)
RBC: 4.17 MIL/uL (ref 3.87–5.11)
RDW: 18.1 % — ABNORMAL HIGH (ref 11.5–15.5)
WBC: 12.6 10*3/uL — ABNORMAL HIGH (ref 4.0–10.5)
nRBC: 0 % (ref 0.0–0.2)

## 2023-08-30 LAB — HEMOGLOBIN A1C
Hgb A1c MFr Bld: 5.5 % (ref 4.8–5.6)
Mean Plasma Glucose: 111.15 mg/dL

## 2023-08-30 LAB — PROCALCITONIN: Procalcitonin: <0.1 ng/mL

## 2023-08-30 MED ORDER — IOHEXOL 350 MG/ML SOLN
100.0000 mL | Freq: Once | INTRAVENOUS | Status: AC | PRN
  Administered 2023-08-30: 75 mL via INTRAVENOUS

## 2023-08-30 NOTE — Discharge Summary (Signed)
 Physician Discharge Summary  Tiffany Hickman GEX:[id number] DOB: [birth year] DOA: 08/29/2023  PCP: [facility]  Admit date: 08/29/2023 Discharge date: 08/30/2023  Admitted From: home  Disposition:  home   Recommendations for Outpatient Follow-up:  Follow up with PCP in 1-2 weeks Needs to CBC done w/in 1 week to check H&H Needs to get a mammogram  Needs to get MRI WWO gando abd/plevis for left kidney density seen on CT   Home Health: no  Equipment/Devices:  Discharge Condition: stable  CODE STATUS: full  Diet recommendation: Heart Healthy   Brief/Interim Summary: HPI was taken from Dr. 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   Vitals in the ED showed temperature 98.4, respiratory rate 16, heart rate of 94, blood pressure 160/92, SpO2 100% on room air.   Serum sodium is 137, potassium 4.1, chloride 106, bicarb 23, BUN of 13, serum creatinine of 2.82, EGFR greater than 60, nonfasting blood glucose 94, WBC 11.5, hemoglobin 11.2, platelets of 354.   COVID/influenza A/influenza B/RSV PCR were negative.   UA was positive for trace leukocytes and squamous epithelial cell was present, 6-10.  No bacteria was seen.  No WBC in the urine.   ED treatment: Tylenol 1000 mg p.o. one-time dose, aspirin 81 mg p.o. one-time dose, oxycodone 5 mg p.o. one-time dose. -------------------------------- At bedside, patient is able to tell me her first and last name, age, location, current calendar year.   She reports yesterday, she developed a persistent menstrual pain like feeling in her left side of her abdomen.  She reports she felt this similar sensation a year ago however it did not last very long and quickly  went away.   She denies trauma to her person, changes to her diet, sexual activity.  She reports she has not been sexually active for over a year.  She denies urinary/vaginal discharge.   She denies dysuria, hematuria, blood in her stool, diarrhea, syncope, loss of consciousness, swelling of the lower extremities.  She denies facial droop, difficulty walking, or difficulty speaking.  Discharge Diagnoses:  Principal Problem:   Renal infarct [facility]) Active Problems:   Mitral valve stenosis   Essential hypertension   Leukocytosis   Obesity, Class III, BMI 40-49.9 (morbid obesity) (HCC)   Abdominal pain   Pyuria  Renal infarct: etiology unclear. CTA chest/abd/pelvis showed Stable 2.6 cm low density noted in lower pole of left kidney of uncertain etiology. MRI with and without gadolinium is recommended for further evaluation. Pt does want to get anymore imaging done inpatient and requests to f/u on this outpatient.    Abdominal pain: no complaints today so far. Oxy prn    Morbid obesity: BMI 43.4. Complicates overall care and prognosis    Leukocytosis: likely reactive. No signs/symptoms on current infection    HTN: continue on losartan, metoprolol,lasix   Mitral valve stenosis: continue outpatient follow-up with cardiothoracic surgeon as referred to by cardiologist  Discharge Instructions  Discharge Instructions     Diet - low sodium heart healthy   Complete by: As directed    Discharge instructions   Complete by: As directed    F/u w/ PCP within 1 week. CTA chest/abd/pelvis findings: 2.4 x 1.6 cm subcutaneous density is seen along inferior portion of right breast with associated skin  thickening. Mammographic correlation is recommended. Stable 2.6 cm low density noted in lower pole of left kidney of uncertain etiology. MRI with and without gadolinium is recommended for further evaluation. So you will to get a mammogram the abnormal finding of the right breast & MRI WWO gadolinium  abd/pelvis to further evaluation this density in the left kidney.  F/u w/ cardiothoracic surgery for mitral valve stenosis   Discharge instructions   Complete by: As directed    Get CBC w/in 1 week to check Hb &Hct   Increase activity slowly   Complete by: As directed       Allergies as of 08/30/2023   No Known Allergies      Medication List     STOP taking these medications    azithromycin 250 MG tablet Commonly known as: Zithromax Z-Pak   benzonatate 100 MG capsule Commonly known as: Tessalon Perles   doxycycline 100 MG capsule Commonly known as: VIBRAMYCIN   guaiFENesin 100 MG/5ML Soln Commonly known as: ROBITUSSIN   predniSONE 50 MG tablet Commonly known as: DELTASONE   traMADol 50 MG tablet Commonly known as: Ultram       TAKE these medications    albuterol 108 (90 Base) MCG/ACT inhaler Commonly known as: VENTOLIN HFA Inhale 2 puffs into the lungs every 6 (six) hours as needed for wheezing or shortness of breath.   chlorhexidine 0.12 % solution Commonly known as: PERIDEX Use as directed 15 mLs in the mouth or throat 2 (two) times daily.   fluticasone 110 MCG/ACT inhaler Commonly known as: FLOVENT HFA Inhale 2 puffs into the lungs daily.   furosemide 20 MG tablet Commonly known as: Lasix Take 2 tablets (40 mg total) by mouth 2 (two) times daily. Take 40mg  (2 tablets) in am and 20mg  (1 tablet) in pm   hydrochlorothiazide 25 MG tablet Commonly known as: HYDRODIURIL Take 25 mg by mouth daily.   losartan 25 MG tablet Commonly known as: COZAAR Take 1 tablet (25 mg total) by mouth daily.   metFORMIN 500 MG tablet Commonly known as: GLUCOPHAGE Take 500 mg by mouth daily with breakfast.   metoprolol succinate 25 MG 24 hr tablet Commonly known as: Toprol XL Take 1 tablet (25 mg total) by mouth daily.   potassium chloride SA 20 MEQ tablet Commonly known as: KLOR-CON M Take 1 tablet (20 mEq total) by mouth daily.   Vitamin D (Ergocalciferol)  1.25 MG (50000 UNIT) Caps capsule Commonly known as: DRISDOL Take 50,000 Units by mouth once a week.        No Known Allergies  Consultations:    Procedures/Studies: CT Angio Chest/Abd/Pel for Dissection W and/or Wo Contrast Result Date: 08/30/2023 CLINICAL DATA:  Possible renal infarction.  Acute aortic syndrome. EXAM: CT ANGIOGRAPHY CHEST, ABDOMEN AND PELVIS TECHNIQUE: Non-contrast CT of the chest was initially obtained. Multidetector CT imaging through the chest, abdomen and pelvis was performed using the standard protocol during bolus administration of intravenous contrast. Multiplanar reconstructed images and MIPs were obtained and reviewed to evaluate the vascular anatomy. RADIATION DOSE REDUCTION: This exam was performed according to the departmental dose-optimization program which includes automated exposure control, adjustment of the mA and/or kV according to patient size and/or use of iterative reconstruction technique. CONTRAST:  75mL OMNIPAQUE IOHEXOL 350 MG/ML SOLN COMPARISON:  August 29, 2023.  January 27, 2020. FINDINGS: CTA CHEST FINDINGS Cardiovascular: Preferential opacification of the thoracic aorta. No evidence of thoracic aortic aneurysm or dissection. Normal heart size. No pericardial effusion. Mediastinum/Nodes: Thyroid  gland and esophagus are unremarkable. No mediastinal adenopathy is noted. Stable right axillary adenopathy is noted which most likely is benign in etiology. Lungs/Pleura: Lungs are clear. No pleural effusion or pneumothorax. Musculoskeletal: 2.4 x 1.6 cm subcutaneous density is seen inferiorly in right breast with associated skin thickening. No acute or significant osseous findings. Review of the MIP images confirms the above findings. CTA ABDOMEN AND PELVIS FINDINGS VASCULAR Aorta: Mild atherosclerosis of abdominal aorta is noted without aneurysm or dissection. Celiac: Patent without evidence of aneurysm, dissection, vasculitis or significant stenosis. SMA:  Patent without evidence of aneurysm, dissection, vasculitis or significant stenosis. Renals: Evaluation of renal arteries is limited due to body habitus. IMA: Not definitively identified. Inflow: Patent without evidence of aneurysm, dissection, vasculitis or significant stenosis. Veins: No obvious venous abnormality within the limitations of this arterial phase study. Review of the MIP images confirms the above findings. NON-VASCULAR Hepatobiliary: No focal liver abnormality is seen. No gallstones, gallbladder wall thickening, or biliary dilatation. Pancreas: Unremarkable. No pancreatic ductal dilatation or surrounding inflammatory changes. Spleen: Normal in size without focal abnormality. Adrenals/Urinary Tract: Adrenal glands appear normal. Stable 2.6 cm low density seen in lower pole of left kidney of uncertain etiology. No hydronephrosis or renal obstruction. Urinary bladder is unremarkable. Stomach/Bowel: Stomach is within normal limits. Appendix appears normal. No evidence of bowel wall thickening, distention, or inflammatory changes. Lymphatic: No adenopathy. Reproductive: Uterus and bilateral adnexa are unremarkable. Other: No ascites or hernia. Musculoskeletal: No acute or significant osseous findings. Review of the MIP images confirms the above findings. IMPRESSION: Significantly limited exam due to body habitus. Renal and splenic arteries are not well visualized as a result. 2.4 x 1.6 cm subcutaneous density is seen along inferior portion of right breast with associated skin thickening. Mammographic correlation is recommended to rule out neoplasm. There is no definite evidence of thoracic or abdominal aortic dissection or aneurysm. Mild atherosclerotic calcifications are noted in abdominal aorta. Stable 2.6 cm low density noted in lower pole of left kidney of uncertain etiology. MRI with and without gadolinium is recommended for further evaluation. Electronically Signed   By: Lupita Raider M.D.   On:  08/30/2023 14:33   CT ABDOMEN PELVIS W CONTRAST Result Date: 08/29/2023 CLINICAL DATA:  Two day history of left lower quadrant pain EXAM: CT ABDOMEN AND PELVIS WITH CONTRAST TECHNIQUE: Multidetector CT imaging of the abdomen and pelvis was performed using the standard protocol following bolus administration of intravenous contrast. RADIATION DOSE REDUCTION: This exam was performed according to the departmental dose-optimization program which includes automated exposure control, adjustment of the mA and/or kV according to patient size and/or use of iterative reconstruction technique. CONTRAST:  OMNIPAQUE IOHEXOL 300 MG/ML  SOLN COMPARISON:  None Available. FINDINGS: Lower chest: No focal consolidation or pulmonary nodule in the lung bases. No pleural effusion or pneumothorax demonstrated. Partially imaged heart size is normal. Hepatobiliary: No focal hepatic lesions. No intra or extrahepatic biliary ductal dilation. Normal gallbladder. Pancreas: No focal lesions or main ductal dilation. Spleen: Normal in size without focal abnormality. Adrenals/Urinary Tract: No adrenal nodules. Left interpolar anterior renal cortical scarring. Wedge-shaped left lower pole hypoattenuation (5:67). No hydronephrosis or calculi. No focal bladder wall thickening. Stomach/Bowel: Normal appearance of the stomach. No evidence of bowel wall thickening, distention, or inflammatory changes. Normal appendix. Vascular/Lymphatic: Aortic atherosclerosis. Bilateral inguinal lymphadenopathy measuring up to 11 mm on the right (2:68). Reproductive: Peripherally enhancing hypoechogenicity in the left adnexa, likely corpus luteum. No right adnexal masses. Other: Small volume  pelvic free fluid. No free air or fluid collection. Musculoskeletal: No acute or abnormal lytic or blastic osseous lesions. IMPRESSION: 1. Wedge-shaped left lower pole hypoattenuation, which may represent pyelonephritis or renal infarction. Recommend correlation with  urinalysis. Consider nonemergent follow-up renal protocol MRI or CT abdomen to ensure resolution and exclude underlying renal lesion. 2. Bilateral inguinal lymphadenopathy, likely reactive. Recommend correlation with physical exam findings of the lower extremities. 3.  Aortic Atherosclerosis (ICD10-I70.0). Electronically Signed   By: Agustin Cree M.D.   On: 08/29/2023 14:39   (Echo, Carotid, EGD, Colonoscopy, ERCP)    Subjective: pt c/o fatigue    Discharge Exam: Vitals:   08/30/23 0403 08/30/23 0813  BP: 113/61 125/61  Pulse: 77 78  Resp: 20 16  Temp: 98.2 F (36.8 C) 97.9 F (36.6 C)  SpO2: 100% 100%   Vitals:   08/29/23 1722 08/29/23 2013 08/30/23 0403 08/30/23 0813  BP: (!) 142/77 125/65 113/61 125/61  Pulse: 87 83 77 78  Resp: 18 20 20 16   Temp: 99.2 F (37.3 C) 98 F (36.7 C) 98.2 F (36.8 C) 97.9 F (36.6 C)  TempSrc: Oral Oral Oral   SpO2: 100% 100% 100% 100%  Weight:      Height:        General: Pt is alert, awake, not in acute distress Cardiovascular: S1/S2 +, no rubs, no gallops Respiratory: CTA bilaterally, no wheezing, no rhonchi Abdominal: Soft, NT, obese, bowel sounds + Extremities:  no cyanosis    The results of significant diagnostics from this hospitalization (including imaging, microbiology, ancillary and laboratory) are listed below for reference.     Microbiology: No results found for this or any previous visit (from the past 240 hours).   Labs: BNP (last 3 results) No results for input(s): "BNP" in the last 8760 hours. Basic Metabolic Panel: Recent Labs  Lab 08/29/23 1232 08/30/23 0510  NA 137 133*  K 4.1 4.0  CL 106 105  CO2 23 21*  GLUCOSE 94 96  BUN 13 15  CREATININE 0.82 0.83  CALCIUM 8.8* 8.7*   Liver Function Tests: Recent Labs  Lab 08/29/23 1232  AST 17  ALT 16  ALKPHOS 68  BILITOT 0.4  PROT 8.4*  ALBUMIN 3.8   Recent Labs  Lab 08/29/23 1232  LIPASE 38   No results for input(s): "AMMONIA" in the last 168  hours. CBC: Recent Labs  Lab 08/29/23 1232 08/30/23 0510  WBC 11.5* 12.6*  HGB 11.2* 9.9*  HCT 37.0 32.0*  MCV 79.1* 76.7*  PLT 354 330   Cardiac Enzymes: No results for input(s): "CKTOTAL", "CKMB", "CKMBINDEX", "TROPONINI" in the last 168 hours. BNP: Invalid input(s): "POCBNP" CBG: No results for input(s): "GLUCAP" in the last 168 hours. D-Dimer No results for input(s): "DDIMER" in the last 72 hours. Hgb A1c No results for input(s): "HGBA1C" in the last 72 hours. Lipid Profile No results for input(s): "CHOL", "HDL", "LDLCALC", "TRIG", "CHOLHDL", "LDLDIRECT" in the last 72 hours. Thyroid function studies No results for input(s): "TSH", "T4TOTAL", "T3FREE", "THYROIDAB" in the last 72 hours.  Invalid input(s): "FREET3" Anemia work up No results for input(s): "VITAMINB12", "FOLATE", "FERRITIN", "TIBC", "IRON", "RETICCTPCT" in the last 72 hours. Urinalysis    Component Value Date/Time   COLORURINE YELLOW (A) 08/29/2023 1232   APPEARANCEUR HAZY (A) 08/29/2023 1232   LABSPEC 1.021 08/29/2023 1232   PHURINE 6.0 08/29/2023 1232   GLUCOSEU NEGATIVE 08/29/2023 1232   HGBUR NEGATIVE 08/29/2023 1232   BILIRUBINUR NEGATIVE 08/29/2023 1232   KETONESUR  NEGATIVE 08/29/2023 1232   PROTEINUR NEGATIVE 08/29/2023 1232   NITRITE NEGATIVE 08/29/2023 1232   LEUKOCYTESUR TRACE (A) 08/29/2023 1232   Sepsis Labs Recent Labs  Lab 08/29/23 1232 08/30/23 0510  WBC 11.5* 12.6*   Microbiology No results found for this or any previous visit (from the past 240 hours).   Time coordinating discharge: Over 30 minutes  SIGNED:   Charise Killian, MD  [facility] 08/30/2023, 3:49 PM Pager   If 7PM-7AM, please contact night-coverage [email or web address]

## 2023-08-30 NOTE — Plan of Care (Signed)

## 2024-02-22 ENCOUNTER — Other Ambulatory Visit: Payer: Self-pay | Admitting: Cardiology

## 2024-02-22 MED ORDER — FUROSEMIDE 20 MG PO TABS: 40.0000 mg | ORAL_TABLET | Freq: Two times a day (BID) | ORAL | 3 refills | Status: AC

## 2024-02-22 MED ORDER — LOSARTAN POTASSIUM 25 MG PO TABS: 25.0000 mg | ORAL_TABLET | Freq: Every day | ORAL | 0 refills | Status: AC

## 2024-03-17 ENCOUNTER — Other Ambulatory Visit: Payer: Self-pay

## 2024-03-17 ENCOUNTER — Emergency Department
Admission: EM | Admit: 2024-03-17 | Discharge: 2024-03-17 | Disposition: A | Discharge status: Home / Self Care | Payer: MEDICAID | Source: Intra-hospital | Attending: Emergency Medicine | Admitting: Emergency Medicine

## 2024-03-17 DIAGNOSIS — B349 Viral infection, unspecified: Secondary | ICD-10-CM | POA: Diagnosis not present

## 2024-03-17 DIAGNOSIS — J45909 Unspecified asthma, uncomplicated: Secondary | ICD-10-CM | POA: Insufficient documentation

## 2024-03-17 DIAGNOSIS — I110 Hypertensive heart disease with heart failure: Secondary | ICD-10-CM | POA: Diagnosis not present

## 2024-03-17 DIAGNOSIS — I5032 Chronic diastolic (congestive) heart failure: Secondary | ICD-10-CM | POA: Insufficient documentation

## 2024-03-17 DIAGNOSIS — R6889 Other general symptoms and signs: Secondary | ICD-10-CM

## 2024-03-17 DIAGNOSIS — R059 Cough, unspecified: Secondary | ICD-10-CM | POA: Diagnosis present

## 2024-03-17 LAB — RESP PANEL BY RT-PCR (RSV, FLU A&B, COVID)  RVPGX2
Influenza A by PCR: NEGATIVE
Influenza B by PCR: NEGATIVE
Resp Syncytial Virus by PCR: NEGATIVE
SARS Coronavirus 2 by RT PCR: NEGATIVE

## 2024-03-17 MED ORDER — GUAIFENESIN ER 600 MG PO TB12: 600.0000 mg | ORAL_TABLET | Freq: Two times a day (BID) | ORAL | 0 refills | Status: AC

## 2024-03-17 NOTE — ED Triage Notes (Addendum)
 Pt to ED via POV from home. Pt reports legs aching, cough, congestion and fatigue since yesterday. Pt reports last week her son had COVID and strep throat. Pt denies sore throat.

## 2024-03-17 NOTE — ED Provider Notes (Signed)
 [facility] Provider Note    Event Date/Time   First MD Initiated Contact with Patient 03/17/24 1852     (approximate)   History   Generalized Body Aches   HPI  Tiffany Hickman is a 43 y.o. female  with a past medical history of HTN, prediabetes, asthma, chronic diastolic heart failure, hidradenitis suppurativa, major depressive disorder, CVA, mitral stenosis presents to the emergency department with cough, rhinorrhea, fatigue, myalgias x 1 day.  States she feels that both of her legs and back are dull and achy.  Patient states last week her son was diagnosed with COVID and strep pharyngitis; she has been in contact with him.  Patient denies chest pain, shortness of breath, leg swelling, rash, otalgia, dysuria, sore throat, increased urinary frequency, abdominal pain, nausea, vomiting.  No history of DVT or PE.  No fall or injury.     Physical Exam   Triage Vital Signs: ED Triage Vitals  Encounter Vitals Group     BP 03/17/24 1752 (!) 162/113     Girls Systolic BP Percentile --      Girls Diastolic BP Percentile --      Boys Systolic BP Percentile --      Boys Diastolic BP Percentile --      Pulse Rate 03/17/24 1752 (!) 105     Resp 03/17/24 1752 18     Temp 03/17/24 1752 98.7 F (37.1 C)     Temp Source 03/17/24 1752 Oral     SpO2 03/17/24 1752 98 %     Weight 03/17/24 1753 260 lb (117.9 kg)     Height 03/17/24 1753 5' 6 (1.676 m)     Head Circumference --      Peak Flow --      Pain Score 03/17/24 1753 7     Pain Loc --      Pain Education --      Exclude from Growth Chart --     Most recent vital signs: Vitals:   03/17/24 1752 03/17/24 2026  BP: (!) 162/113 (!) 153/100  Pulse: (!) 105 97  Resp: 18   Temp: 98.7 F (37.1 C)   SpO2: 98% 99%    General: Awake, in no acute distress. Appears stated age. Head: Normocephalic, atraumatic. Eyes: No scleral icterus or conjunctival injection. Ears/Nose/Throat: TMs intact b/l. Nares  patent, no nasal discharge. Oropharynx moist, no erythema or exudate. Dentition intact. Neck: Supple, no lymphadenopathy, no nuchal rigidity. CV: Good peripheral perfusion. No lower extremity edema.  Respiratory:Normal respiratory effort.  No respiratory distress. CTAB. GI: Soft, non-distended, non-tender.  MSK: Moving all extremities with ease. Skin:Warm, dry, intact. No rashes, lesions, or ecchymosis. No cyanosis or pallor. Neurological: A&Ox4 to person, place, time, and situation.    ED Results / Procedures / Treatments   Labs (all labs ordered are listed, but only abnormal results are displayed) Labs Reviewed  RESP PANEL BY RT-PCR (RSV, FLU A&B, COVID)  [id number]     EKG    RADIOLOGY    PROCEDURES:  Critical Care performed: No   Procedures   MEDICATIONS ORDERED IN ED: Medications - No data to display   IMPRESSION / MDM / ASSESSMENT AND PLAN / ED COURSE  I reviewed the triage vital signs and the nursing notes.                              Differential diagnosis includes, but is  not limited to, acute viral illness, COVID, flu, RSV, pneumonia  Patient's presentation is most consistent with acute complicated illness / injury requiring diagnostic workup.  Patient is a 43 year old female presenting with URI/flu-like symptoms x 1 day.  Patient does have a sick contact.  Respiratory panel ordered and is negative for COVID, flu and RSV.  Patient's lung exam is reassuring with clear auscultation bilaterally.  Neurovascularly intact.  Discussed using Tylenol  and ibuprofen  at home for pain and fever as well as provided Mucinex  for rhinorrhea.  Will have patient follow-up with her primary care provider following today's visit.   The patient may return to the emergency department for any new, worsening, or concerning symptoms. Patient was given the opportunity to ask questions; all questions were answered. Emergency department return precautions were discussed with the patient.   Patient is in agreement to the treatment plan.  Patient is stable for discharge.   FINAL CLINICAL IMPRESSION(S) / ED DIAGNOSES   Final diagnoses:  Viral syndrome  Flu-like symptoms     Rx / DC Orders   ED Discharge Orders          Ordered    guaiFENesin  (MUCINEX ) 600 MG 12 hr tablet  2 times daily        03/17/24 1954             Note:  This document was prepared using Dragon voice recognition software and may include unintentional dictation errors.     Sheron Salm, PA-C 03/17/24 2029    Levander Slate, MD 03/19/24 1520

## 2024-03-17 NOTE — Discharge Instructions (Addendum)
 You have been seen in the Emergency Department (ED) today for a likely viral illness.  Please drink plenty of clear fluids (water, Gatorade, chicken broth, etc).  You may use Tylenol  and/or Motrin  according to label instructions.  You can alternate between the two without any side effects. I have also sent a prescription for Mucinex .  Please follow up with your doctor as listed above.  Call your doctor or return to the Emergency Department (ED) if you are unable to tolerate fluids due to vomiting, have worsening trouble breathing, become extremely tired or difficult to awaken, or if you develop any other symptoms that concern you.

## 2024-04-10 ENCOUNTER — Emergency Department
Admission: EM | Admit: 2024-04-10 | Discharge: 2024-04-10 | Disposition: A | Discharge status: Home / Self Care | Payer: MEDICAID | Attending: Emergency Medicine | Admitting: Emergency Medicine

## 2024-04-10 ENCOUNTER — Emergency Department: Payer: MEDICAID

## 2024-04-10 ENCOUNTER — Other Ambulatory Visit: Payer: Self-pay

## 2024-04-10 DIAGNOSIS — I110 Hypertensive heart disease with heart failure: Secondary | ICD-10-CM | POA: Diagnosis not present

## 2024-04-10 DIAGNOSIS — I509 Heart failure, unspecified: Secondary | ICD-10-CM | POA: Diagnosis not present

## 2024-04-10 DIAGNOSIS — J45909 Unspecified asthma, uncomplicated: Secondary | ICD-10-CM | POA: Diagnosis not present

## 2024-04-10 DIAGNOSIS — W57XXXA Bitten or stung by nonvenomous insect and other nonvenomous arthropods, initial encounter: Secondary | ICD-10-CM | POA: Insufficient documentation

## 2024-04-10 DIAGNOSIS — S80862A Insect bite (nonvenomous), left lower leg, initial encounter: Secondary | ICD-10-CM | POA: Diagnosis present

## 2024-04-10 MED ORDER — PREDNISONE 50 MG PO TABS: 50.0000 mg | ORAL_TABLET | Freq: Every day | ORAL | 0 refills | Status: AC

## 2024-04-10 MED ORDER — PREDNISONE 20 MG PO TABS
60.0000 mg | ORAL_TABLET | Freq: Once | ORAL | Status: AC
  Administered 2024-04-10: 60 mg via ORAL
  Filled 2024-04-10: qty 3

## 2024-04-10 NOTE — ED Triage Notes (Addendum)
 Pt presents with localized L anterior upper leg numbness and swelling that started while she was standing at the grocery store around 1300 today. She denies any back pain, incontinence, saddle paresthesia. Brief triage assessment pt has 5/5 strength to bilateral upper and lower extremities. Decreased sensation noted to localized area of upper L thigh, sensation fully intact distally.

## 2024-04-10 NOTE — Discharge Instructions (Addendum)
 You have been diagnosed with insect bite.  Ultrasound rule it out clot in your leg.  Please take prednisone  1 tablet with breakfast the next 3 days.  Please come back to ED or go to your PCP if you have new symptoms or symptoms worsen.  It was a pleasure to help you today.  Lorane Sar, [place]

## 2024-04-10 NOTE — ED Provider Notes (Signed)
 [facility] Provider Note    Event Date/Time   First MD Initiated Contact with Patient 04/10/24 1748     (approximate)   History   Numbness (/)    HPI  Tiffany Hickman Hickman is a 43 y.o. female    with a past medical history of kidney infarction, bronchitis, mitral valve stenosis, cardiac heart failure, asthma, pneumonia,  who presents to the ED complaining of numbness. According to the patient, symptoms started today when she was in a grocery store feeling itchiness in her upper left thigh with subsequent edema and numbness.  Patient denies recent traveling, trauma, shortness of breath, wheezing.Patient is here by herself.     Patient Active Problem List   Diagnosis Date Noted   Renal infarct 08/29/2023   Leukocytosis 08/29/2023   Obesity, Class III, BMI 40-49.9 (morbid obesity) (HCC) 08/29/2023   Abdominal pain 08/29/2023   Pyuria 08/29/2023   Mitral valve stenosis 02/24/2019   Essential hypertension 02/24/2019     ROS: Patient currently denies any vision changes, tinnitus, difficulty speaking, facial droop, sore throat, chest pain, shortness of breath, abdominal pain, nausea/vomiting/diarrhea, dysuria, or weakness/numbness/paresthesias in any extremity   Physical Exam   Triage Vital Signs: ED Triage Vitals  Encounter Vitals Group     BP 04/10/24 1714 (!) 153/92     Girls Systolic BP Percentile --      Girls Diastolic BP Percentile --      Boys Systolic BP Percentile --      Boys Diastolic BP Percentile --      Pulse Rate 04/10/24 1714 (!) 110     Resp 04/10/24 1714 18     Temp 04/10/24 1714 98.7 F (37.1 C)     Temp Source 04/10/24 1714 Oral     SpO2 04/10/24 1714 100 %     Weight 04/10/24 1724 261 lb (118.4 kg)     Height 04/10/24 1724 5' 6 (1.676 m)     Head Circumference --      Peak Flow --      Pain Score 04/10/24 1724 0     Pain Loc --      Pain Education --      Exclude from Growth Chart --     Most recent vital  signs: Vitals:   04/10/24 1714  BP: (!) 153/92  Pulse: (!) 110  Resp: 18  Temp: 98.7 F (37.1 C)  SpO2: 100%     Physical Exam Vitals and nursing note reviewed.  During triage patient was hypertensive, tachycardic, not tachypnea.  General:          Awake, no distress.  CV:                  Good peripheral perfusion.  Resp:               Normal effort. no tachypnea, no wheezing Abd:                 No distention.  Soft nontender Other:              Left thigh: Skin is intact, no signs of insect bite.  Edema in the anterior superior for of the thigh.  Mild warmth.  Sensation is intact.  ED Results / Procedures / Treatments   Labs (all labs ordered are listed, but only abnormal results are displayed) Labs Reviewed - No data to display     RADIOLOGY I independently reviewed and interpreted imaging and  agree with radiologists findings.     PROCEDURES:  Critical Care performed:   Procedures   MEDICATIONS ORDERED IN ED: Medications  predniSONE  (DELTASONE ) tablet 60 mg (60 mg Oral Given 04/10/24 1815)   Clinical Course as of 04/10/24 1931  Sun Apr 10, 2024  1925 US  Venous Img Lower Unilateral Left No evidence of deep venous thrombosis.  [AE]  1925 Reassessed the patient after having prednisone .  Patient endorses decreased of the edema.  Updated patient with results of ultrasound ruling out DVT.  Patient is going to be discharged with prednisone  for 3 days.  She is agreeable with the plan [AE]    Clinical Course User Index [AE] Janit Kast, PA-C    IMPRESSION / MDM / ASSESSMENT AND PLAN / ED COURSE  I reviewed the triage vital signs and the nursing notes.  Differential diagnosis includes, but is not limited to, insect bite, DVT, trauma  Patient's presentation is most consistent with acute complicated illness / injury requiring diagnostic workup.   Tiffany Hickman Hickman is a 44 y.o., female who presents today with history of itchiness, edema, numbness on her  left anterior upper thigh.  On a physical exam skin is intact no evidence of insect bite, no signs of trauma.  No tenderness to palpation, edema, mild warmth.  Rest of physical exam is normal. Plan Ultrasound Prednisone  Patient's diagnosis is consistent with insect bite. I independently reviewed and interpreted imaging and agree with radiologists findings running out DVT.  I did not order any labs, physical exam was reassuring. I did review the patient's allergies and medications.The patient is in stable and satisfactory condition for discharge home  Patient will be discharged home with prescriptions for prednisone . Patient is to follow up with PCP as needed or otherwise directed. Patient is given ED precautions to return to the ED for any worsening or new symptoms. Discussed plan of care with patient, answered all of patient's questions, Patient agreeable to plan of care. Advised patient to take medications according to the instructions on the label. Discussed possible side effects of new medications. Patient verbalized understanding.  FINAL CLINICAL IMPRESSION(S) / ED DIAGNOSES   Final diagnoses:  Insect bite of left lower extremity, initial encounter     Rx / DC Orders   ED Discharge Orders          Ordered    predniSONE  (DELTASONE ) 50 MG tablet  Daily with breakfast        04/10/24 1931             Note:  This document was prepared using Dragon voice recognition software and may include unintentional dictation errors.   Janit Kast, PA-C 04/10/24 1931    Arlander Charleston, MD 04/12/24 [phone]

## 2024-08-03 ENCOUNTER — Emergency Department
Admission: EM | Admit: 2024-08-03 | Discharge: 2024-08-03 | Disposition: A | Discharge status: Home / Self Care | Payer: MEDICAID

## 2024-08-03 ENCOUNTER — Other Ambulatory Visit: Payer: Self-pay

## 2024-08-03 DIAGNOSIS — T23101A Burn of first degree of right hand, unspecified site, initial encounter: Secondary | ICD-10-CM | POA: Insufficient documentation

## 2024-08-03 DIAGNOSIS — T310 Burns involving less than 10% of body surface: Secondary | ICD-10-CM | POA: Diagnosis not present

## 2024-08-03 DIAGNOSIS — X118XXA Contact with other hot tap-water, initial encounter: Secondary | ICD-10-CM | POA: Diagnosis not present

## 2024-08-03 DIAGNOSIS — Z760 Encounter for issue of repeat prescription: Secondary | ICD-10-CM | POA: Insufficient documentation

## 2024-08-03 DIAGNOSIS — T23271A Burn of second degree of right wrist, initial encounter: Secondary | ICD-10-CM | POA: Insufficient documentation

## 2024-08-03 DIAGNOSIS — T300 Burn of unspecified body region, unspecified degree: Secondary | ICD-10-CM

## 2024-08-03 MED ORDER — OXYCODONE HCL 5 MG PO TABS
5.0000 mg | ORAL_TABLET | Freq: Once | ORAL | Status: DC
  Filled 2024-08-03: qty 1

## 2024-08-03 MED ORDER — LOSARTAN POTASSIUM 25 MG PO TABS: 25.0000 mg | ORAL_TABLET | Freq: Every day | ORAL | 0 refills | Status: AC

## 2024-08-03 MED ORDER — OXYCODONE HCL 5 MG PO TABS: 5.0000 mg | ORAL_TABLET | ORAL | 0 refills | Status: AC | PRN

## 2024-08-03 MED ORDER — OXYCODONE-ACETAMINOPHEN 5-325 MG PO TABS: 1.0000 | ORAL_TABLET | Freq: Once | ORAL | Status: DC

## 2024-08-03 MED ORDER — OXYCODONE HCL 5 MG PO TABS: 5.0000 mg | ORAL_TABLET | ORAL | 0 refills | Status: DC | PRN

## 2024-08-03 NOTE — ED Notes (Signed)
 [facility]  transfer  center  called
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
